# Patient Record
Sex: Female | Born: 1955
Health system: Southern US, Community
[De-identification: ages and names within clinical notes are randomized; demographics above are authoritative.]

## PROBLEM LIST (undated history)

## (undated) DIAGNOSIS — E785 Hyperlipidemia, unspecified: Secondary | ICD-10-CM

## (undated) DIAGNOSIS — G4730 Sleep apnea, unspecified: Secondary | ICD-10-CM

## (undated) DIAGNOSIS — F32A Depression, unspecified: Secondary | ICD-10-CM

## (undated) DIAGNOSIS — K219 Gastro-esophageal reflux disease without esophagitis: Secondary | ICD-10-CM

## (undated) DIAGNOSIS — F99 Mental disorder, not otherwise specified: Secondary | ICD-10-CM

## (undated) DIAGNOSIS — Z9889 Other specified postprocedural states: Secondary | ICD-10-CM

## (undated) DIAGNOSIS — T7840XA Allergy, unspecified, initial encounter: Secondary | ICD-10-CM

## (undated) DIAGNOSIS — M1990 Unspecified osteoarthritis, unspecified site: Secondary | ICD-10-CM

## (undated) DIAGNOSIS — M5126 Other intervertebral disc displacement, lumbar region: Secondary | ICD-10-CM

## (undated) DIAGNOSIS — H269 Unspecified cataract: Secondary | ICD-10-CM

## (undated) DIAGNOSIS — N951 Menopausal and female climacteric states: Secondary | ICD-10-CM

## (undated) DIAGNOSIS — I10 Essential (primary) hypertension: Secondary | ICD-10-CM

## (undated) HISTORY — DX: Other intervertebral disc displacement, lumbar region: M51.26

## (undated) HISTORY — PX: BREAST EXCISIONAL BIOPSY: SUR124

## (undated) HISTORY — DX: Mental disorder, not otherwise specified: F99

## (undated) HISTORY — DX: Allergy, unspecified, initial encounter: T78.40XA

## (undated) HISTORY — DX: Unspecified cataract: H26.9

## (undated) HISTORY — PX: STEROID INJECTION TO SCAR: SHX2447

## (undated) HISTORY — DX: Depression, unspecified: F32.A

## (undated) HISTORY — DX: Hyperlipidemia, unspecified: E78.5

## (undated) HISTORY — DX: Sleep apnea, unspecified: G47.30

## (undated) HISTORY — DX: Gastro-esophageal reflux disease without esophagitis: K21.9

## (undated) HISTORY — DX: Essential (primary) hypertension: I10

## (undated) HISTORY — PX: JOINT REPLACEMENT: SHX530

## (undated) HISTORY — DX: Menopausal and female climacteric states: N95.1

## (undated) HISTORY — DX: Other specified postprocedural states: Z98.890

## (undated) HISTORY — PX: FRACTURE SURGERY: SHX138

## (undated) HISTORY — DX: Unspecified osteoarthritis, unspecified site: M19.90

---

## 1988-05-16 HISTORY — PX: BREAST BIOPSY: SHX20

## 2001-11-13 HISTORY — PX: FOOT FRACTURE SURGERY: SHX645

## 2002-05-16 LAB — HM COLONOSCOPY: HM Colonoscopy: NORMAL

## 2004-10-14 HISTORY — PX: ANKLE FRACTURE SURGERY: SHX122

## 2008-07-14 HISTORY — PX: KNEE ARTHROSCOPY: SHX127

## 2009-01-27 ENCOUNTER — Encounter: Admission: RE | Admit: 2009-01-27 | Discharge: 2009-01-27 | Payer: Self-pay | Admitting: General Surgery

## 2009-03-27 ENCOUNTER — Ambulatory Visit: Payer: Self-pay | Admitting: Family Medicine

## 2009-03-27 DIAGNOSIS — E785 Hyperlipidemia, unspecified: Secondary | ICD-10-CM | POA: Insufficient documentation

## 2009-03-27 DIAGNOSIS — J309 Allergic rhinitis, unspecified: Secondary | ICD-10-CM | POA: Insufficient documentation

## 2009-03-27 DIAGNOSIS — I10 Essential (primary) hypertension: Secondary | ICD-10-CM | POA: Insufficient documentation

## 2009-03-27 DIAGNOSIS — F339 Major depressive disorder, recurrent, unspecified: Secondary | ICD-10-CM | POA: Insufficient documentation

## 2009-03-27 LAB — CONVERTED CEMR LAB
Bilirubin Urine: NEGATIVE
Blood in Urine, dipstick: NEGATIVE
Glucose, Urine, Semiquant: NEGATIVE
Ketones, urine, test strip: NEGATIVE
Nitrite: NEGATIVE
Protein, U semiquant: NEGATIVE
Specific Gravity, Urine: 1.01
Urobilinogen, UA: 0.2
WBC Urine, dipstick: NEGATIVE
pH: 5.5

## 2009-04-17 ENCOUNTER — Encounter: Payer: Self-pay | Admitting: Family Medicine

## 2009-04-18 LAB — CONVERTED CEMR LAB
ALT: 20 units/L (ref 0–35)
AST: 17 units/L (ref 0–37)
Albumin: 4.2 g/dL (ref 3.5–5.2)
Alkaline Phosphatase: 74 units/L (ref 39–117)
BUN: 23 mg/dL (ref 6–23)
CO2: 24 meq/L (ref 19–32)
Calcium: 9.4 mg/dL (ref 8.4–10.5)
Chloride: 103 meq/L (ref 96–112)
Creatinine, Ser: 0.92 mg/dL (ref 0.40–1.20)
Glucose, Bld: 104 mg/dL — ABNORMAL HIGH (ref 70–99)
Potassium: 4.2 meq/L (ref 3.5–5.3)
Sodium: 141 meq/L (ref 135–145)
Total Bilirubin: 0.7 mg/dL (ref 0.3–1.2)
Total Protein: 6.8 g/dL (ref 6.0–8.3)

## 2009-04-27 ENCOUNTER — Ambulatory Visit: Payer: Self-pay | Admitting: Family Medicine

## 2009-06-08 ENCOUNTER — Telehealth: Payer: Self-pay | Admitting: Family Medicine

## 2009-07-14 ENCOUNTER — Encounter: Admission: RE | Admit: 2009-07-14 | Discharge: 2009-07-14 | Payer: Self-pay | Admitting: General Surgery

## 2009-07-15 LAB — HM MAMMOGRAPHY: HM Mammogram: NORMAL

## 2009-09-22 ENCOUNTER — Ambulatory Visit: Payer: Self-pay | Admitting: Family Medicine

## 2009-09-22 DIAGNOSIS — I6529 Occlusion and stenosis of unspecified carotid artery: Secondary | ICD-10-CM | POA: Insufficient documentation

## 2009-09-24 ENCOUNTER — Ambulatory Visit (HOSPITAL_COMMUNITY): Admission: RE | Admit: 2009-09-24 | Discharge: 2009-09-24 | Payer: Self-pay | Admitting: Family Medicine

## 2009-09-24 ENCOUNTER — Ambulatory Visit: Payer: Self-pay | Admitting: Vascular Surgery

## 2009-09-24 ENCOUNTER — Encounter: Payer: Self-pay | Admitting: Family Medicine

## 2009-10-21 ENCOUNTER — Encounter: Payer: Self-pay | Admitting: Family Medicine

## 2009-10-21 ENCOUNTER — Ambulatory Visit: Payer: Self-pay | Admitting: Obstetrics & Gynecology

## 2009-10-23 LAB — CONVERTED CEMR LAB
ALT: 20 units/L (ref 0–35)
AST: 17 units/L (ref 0–37)
Albumin: 4.4 g/dL (ref 3.5–5.2)
Alkaline Phosphatase: 78 units/L (ref 39–117)
BUN: 21 mg/dL (ref 6–23)
CO2: 22 meq/L (ref 19–32)
Calcium: 9.7 mg/dL (ref 8.4–10.5)
Chloride: 105 meq/L (ref 96–112)
Cholesterol: 208 mg/dL — ABNORMAL HIGH (ref 0–200)
Creatinine, Ser: 0.85 mg/dL (ref 0.40–1.20)
Glucose, Bld: 97 mg/dL (ref 70–99)
HDL: 72 mg/dL (ref >39)
LDL Cholesterol: 111 mg/dL — ABNORMAL HIGH (ref 0–99)
Potassium: 4.2 meq/L (ref 3.5–5.3)
Sodium: 141 meq/L (ref 135–145)
Total Bilirubin: 1 mg/dL (ref 0.3–1.2)
Total CHOL/HDL Ratio: 2.9
Total Protein: 7.4 g/dL (ref 6.0–8.3)
Triglycerides: 127 mg/dL (ref <150)
VLDL: 25 mg/dL (ref 0–40)

## 2009-12-10 ENCOUNTER — Encounter: Admission: RE | Admit: 2009-12-10 | Discharge: 2009-12-10 | Payer: Self-pay | Admitting: General Surgery

## 2010-01-27 ENCOUNTER — Ambulatory Visit: Payer: Self-pay | Admitting: Family Medicine

## 2010-03-15 ENCOUNTER — Encounter: Payer: Self-pay | Admitting: Family Medicine

## 2010-03-16 HISTORY — PX: REPLACEMENT TOTAL KNEE: SUR1224

## 2010-03-22 ENCOUNTER — Inpatient Hospital Stay (HOSPITAL_COMMUNITY): Admission: RE | Admit: 2010-03-22 | Discharge: 2010-03-25 | Payer: Self-pay | Admitting: Orthopedic Surgery

## 2010-03-30 ENCOUNTER — Encounter: Payer: Self-pay | Admitting: Family Medicine

## 2010-05-05 ENCOUNTER — Encounter
Admission: RE | Admit: 2010-05-05 | Discharge: 2010-05-05 | Payer: Self-pay | Source: Home / Self Care | Attending: Sports Medicine | Admitting: Sports Medicine

## 2010-06-14 ENCOUNTER — Ambulatory Visit: Admit: 2010-06-14 | Payer: Self-pay | Admitting: Family Medicine

## 2010-06-15 NOTE — Medication Information (Signed)
Summary: Med Issue Order/[facility]  Med Issue Order/[facility]   Imported By: Lanelle Bal 04/12/2010 08:28:43  _____________________________________________________________________  External Attachment:    Type:   Image     Comment:   External Document

## 2010-06-15 NOTE — Assessment & Plan Note (Signed)
Summary: MEDICAL CLEARANCE   Vital Signs:  Patient profile:   55 year old female Height:      65 inches Weight:      257 pounds BMI:     42.92 Pulse rate:   65 / minute BP sitting:   130 / 70  (right arm) Cuff size:   large  Vitals Entered By: Avon Gully CMA, Duncan Dull) (January 27, 2010 8:53 AM) CC: Med clearance,   Primary Care Provider:  [name]  CC:  Med clearance and .  History of Present Illness: Right knee surgery in November with Dr. Despina Hick at [facility].  Her for medical clearance.  She has the following medical problems  Current Problems:  HYPERTENSION, BENIGN (ICD-401.1) DEPRESSION (ICD-311) HYPERLIPIDEMIA (ICD-272.4) ALLERGIC RHINITIS (ICD-477.9)        Current Medications (verified): 1)  Losartan Potassium-Hctz 50-12.5 Mg Tabs (Losartan Potassium-Hctz) .... Take 1 Tablet By Mouth Once A Day 2)  Simvastatin 40 Mg Tabs (Simvastatin) .Marland Kitchen.. 1 1/2 Tab Once Daily 3)  Fexofenadine Hcl 180 Mg Tabs (Fexofenadine Hcl) .Marland Kitchen.. 1 Once Daily As Needed Allergies 4)  Baby Aspirin 81 Mg Chew (Aspirin) .... Once Daily 5)  Lexapro 20 Mg Tabs (Escitalopram Oxalate) .... Take One Tablet By Mouth Once A Day 6)  Meloxicam 7.5 Mg Tabs (Meloxicam) .... Take 1 Tablet By Mouth Once A Day 7)  Tramadol-Acetaminophen 37.5-325 Mg Tabs (Tramadol-Acetaminophen) .Marland Kitchen.. 1-2 Tabs By [facility] Every 4-6 Hours As Needed  Allergies (verified): 1)  ! Penicillin  Comments:  Nurse/Medical Assistant: The patient's medications and allergies were reviewed with the patient and were updated in the Medication and Allergy Lists. Avon Gully CMA, Duncan Dull) (January 27, 2010 8:54 AM)  Past History:  Past Medical History: Last updated: 03/27/2009 Allergic rhinitis Hyperlipidemia  Past Surgical History: Breast biopsy 1990, again in 12/2007 benign - Dr. Dwain Sarna at [facility] Right knee Arthroscopy 07/2008 -Dr. 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  Some college. Married to [place] with 2 adult kids.  Never Smoked Alcohol use-no Drug use-no Regular exercise-no 4 caffeinated drinks per day.    Review of Systems  The patient denies anorexia, fever, weight loss, weight gain, vision loss, decreased hearing, hoarseness, chest pain, syncope, dyspnea on exertion, peripheral edema, prolonged cough, headaches, hemoptysis, abdominal pain, melena, hematochezia, severe indigestion/heartburn, hematuria, incontinence, genital sores, muscle weakness, suspicious skin lesions, transient blindness, difficulty walking, depression, unusual weight change, abnormal bleeding, enlarged lymph nodes, and breast masses.    Physical Exam  General:  Well-developed,well-nourished,in no acute distress; alert,appropriate and cooperative throughout examination Head:  Normocephalic and atraumatic without obvious abnormalities. No apparent alopecia or balding. Eyes:  No corneal or conjunctival inflammation noted. EOMI. Perrla.  Ears:  External ear exam shows no significant lesions or deformities.  Otoscopic examination reveals clear canals, tympanic membranes are intact bilaterally without bulging, retraction, inflammation or discharge. Hearing is grossly normal bilaterally. Nose:  External nasal examination shows no deformity or inflammation.  Mouth:  Oral mucosa and oropharynx without lesions or exudates.  Neck:  No deformities, masses, or tenderness noted. Chest Wall:  No deformities, masses, or tenderness noted. Lungs:  Normal respiratory effort, chest expands symmetrically. Lungs are clear to auscultation, no crackles  or wheezes. Heart:  Normal rate and regular rhythm.  S1 and S2 normal without gallop, murmur, click, rub or other extra sounds. Abdomen:  Bowel sounds positive,abdomen soft and non-tender without masses, organomegaly or hernias noted. Msk:  No deformity or scoliosis noted of thoracic or lumbar spine.   Pulses:  R and L carotid,radial,dorsalis pedis and posterior tibial pulses are full and equal bilaterally Extremities:  No clubbing, cyanosis, edema, or deformity noted with normal full range of motion of all joints.   Neurologic:  No cranial nerve deficits noted. Station and gait are normal.  DTRs are symmetrical throughout. Sensory, motor and coordinative functions appear intact. Skin:  no rashes.   Cervical Nodes:  No lymphadenopathy noted Psych:  Cognition and judgment appear intact. Alert and cooperative with normal attention span and concentration. No apparent delusions, illusions, hallucinations   Impression & Recommendations:  Problem # 1:  UNSPECIFIED GENERAL MEDICAL EXAMINATION (ICD-V70.9)  Exam is normal today. EKG shows Assumming her labs are normal then she is medically cleared for surgery. We call ed teh [place] ortho office and they will draw labs prior to surgery.  She plans on getting her flu vac through work. EKG shows  rate 68 bpm, no acute chages, NSR.    Orders: EKG w/ Interpretation (93000)  Complete Medication List: 1)  Losartan Potassium-hctz 50-12.5 Mg Tabs (Losartan potassium-hctz) .... Take 1 tablet by mouth once a day 2)  Simvastatin 40 Mg Tabs (Simvastatin) .Marland Kitchen.. 1 1/2 tab once daily 3)  Fexofenadine Hcl 180 Mg Tabs (Fexofenadine hcl) .Marland Kitchen.. 1 once daily as needed allergies 4)  Baby Aspirin 81 Mg Chew (Aspirin) .... Once daily 5)  Lexapro 20 Mg Tabs (Escitalopram oxalate) .... Take one tablet by mouth once a day 6)  Meloxicam 7.5 Mg Tabs (Meloxicam) .... Take 1 tablet by mouth once a day 7)  Tramadol-acetaminophen 37.5-325 Mg Tabs (Tramadol-acetaminophen) .Marland Kitchen.. 1-2 tabs by mouthe every 4-6 hours as needed

## 2010-06-15 NOTE — Progress Notes (Signed)
Summary: med question  Phone Note Call from Patient Call back at [facility] Phone [phone] Call back at [phone]   Caller: Patient Call For: [name] Summary of Call: Avalide on back order. Can they switch to Hyzaar 50/12.5mg  100/12.5mg  or 100/25mg . Or any other BP med. Or give Avapro and HCTZ which will results in 2 co pays.  Ref # [id number] Initial call taken by: Kathlene November,  June 08, 2009 3:03 PM  Follow-up for Phone Call        oK to substitute for hyzaar 50/12.5mg  once daily  Follow-up by: [name],  June 08, 2009 3:55 PM  Additional Follow-up for Phone Call Additional follow up Details #1::        Pharmacy notified to change med. Additional Follow-up by: Kathlene November,  June 08, 2009 4:00 PM

## 2010-06-15 NOTE — Assessment & Plan Note (Signed)
Summary: 6 MONTH FU HTN, lipids, mood ,etc   Vital Signs:  Patient profile:   55 year old female Height:      65 inches Weight:      256 pounds Pulse rate:   81 / minute BP sitting:   119 / 68  (left arm) Cuff size:   large  Vitals Entered By: Kathlene November (Sep 22, 2009 8:31 AM) CC: followup BP, Hypertension Management   Primary Care Provider:  [name]  CC:  followup BP and Hypertension Management.  History of Present Illness: Has lost 12  lbs. On weight watchers and doing well.  Her eto f/u BP. Days she has a hx of carotid artery stenosis and used to get dopplers yearly. We still odn't have her old records. Says has been 2 years since her last dopppler and would like to go ahead and schedule that.   Hypertension History:      She denies headache, chest pain, palpitations, dyspnea with exertion, orthopnea, PND, peripheral edema, visual symptoms, neurologic problems, syncope, and side effects from treatment.  She notes no problems with any antihypertensive medication side effects.        Positive major cardiovascular risk factors include female age 66 years old or older, hyperlipidemia, and hypertension.  Negative major cardiovascular risk factors include non-tobacco-user status.     Current Medications (verified): 1)  Avalide 150-12.5 Mg Tabs (Irbesartan-Hydrochlorothiazide) .... Take 1 Tablet By Mouth Once A Day 2)  Simvastatin 40 Mg Tabs (Simvastatin) .Marland Kitchen.. 1 1/2 Tab Once Daily 3)  Fexofenadine Hcl 180 Mg Tabs (Fexofenadine Hcl) .Marland Kitchen.. 1 Once Daily As Needed Allergies 4)  Baby Aspirin 81 Mg Chew (Aspirin) .... Once Daily 5)  Lexapro 20 Mg Tabs (Escitalopram Oxalate) .... Take One Tablet By Mouth Once A Day  Allergies (verified): 1)  ! Penicillin  Comments:  Nurse/Medical Assistant: The patient's medications and allergies were reviewed with the patient and were updated in the Medication and Allergy Lists. Kathlene November (Sep 22, 2009 8:31 AM)  Physical  Exam  General:  Well-developed,well-nourished,in no acute distress; alert,appropriate and cooperative throughout examination Head:  Normocephalic and atraumatic without obvious abnormalities. No apparent alopecia or balding. Lungs:  Normal respiratory effort, chest expands symmetrically. Lungs are clear to auscultation, no crackles or wheezes. Heart:  Normal rate and regular rhythm. S1 and S2 normal without gallop, murmur, click, rub or other extra sounds. Skin:  no rashes.   Cervical Nodes:  No lymphadenopathy noted Psych:  Cognition and judgment appear intact. Alert and cooperative with normal attention span and concentration. No apparent delusions, illusions, hallucinations   Impression & Recommendations:  Problem # 1:  HYPERTENSION, BENIGN (ICD-401.1) Doing well. I keeps up weight loss may be able to decrease her medication. NOt having any dizziness.   Her updated medication list for this problem includes:    Losartan Potassium-hctz 50-12.5 Mg Tabs (Losartan potassium-hctz) .Marland Kitchen... Take 1 tablet by mouth once a day  BP today: 119/68 Prior BP: 114/67 (04/27/2009)  Prior 10 Yr Risk Heart Disease: Not enough information (04/27/2009)  Labs Reviewed: K+: 4.2 (04/17/2009) Creat: : 0.92 (04/17/2009)     Orders: T-Comprehensive Metabolic Panel ([id number])  Problem # 2:  DEPRESSION (ICD-311) Needs refill on lexapro. SHe is happy wiht her current dose and regimen.  Her updated medication list for this problem includes:    Lexapro 20 Mg Tabs (Escitalopram oxalate) .Marland Kitchen... Take one tablet by mouth once a day  Problem # 3:  CAROTID STENOSIS (ICD-433.10) Will schedule  for dopplers. sine she is on a statin ( if she is well controlled) then she may not need yearly dopplers. Will get old records and order new test.  Her updated medication list for this problem includes:    Baby Aspirin 81 Mg Chew (Aspirin) ..... Once daily  Orders: T-*Unlisted Diagnostic X-ray test/procedure  ([address])  Problem # 4:  HYPERLIPIDEMIA (ICD-272.4) Over due to check levels to make sure well controlled. Clerly her weight loss will help.  Her updated medication list for this problem includes:    Simvastatin 40 Mg Tabs (Simvastatin) .Marland Kitchen... 1 1/2 tab once daily  Orders: T-Comprehensive Metabolic Panel [phone]) T-Lipid Profile [id number])  Complete Medication List: 1)  Losartan Potassium-hctz 50-12.5 Mg Tabs (Losartan potassium-hctz) .... Take 1 tablet by mouth once a day 2)  Simvastatin 40 Mg Tabs (Simvastatin) .Marland Kitchen.. 1 1/2 tab once daily 3)  Fexofenadine Hcl 180 Mg Tabs (Fexofenadine hcl) .Marland Kitchen.. 1 once daily as needed allergies 4)  Baby Aspirin 81 Mg Chew (Aspirin) .... Once daily 5)  Lexapro 20 Mg Tabs (Escitalopram oxalate) .... Take one tablet by mouth once a day 6)  Meloxicam 7.5 Mg Tabs (Meloxicam) .... Take 1 tablet by mouth once a day 7)  Tramadol-acetaminophen 37.5-325 Mg Tabs (Tramadol-acetaminophen) .Marland Kitchen.. 1-2 tabs by mouthe every 4-6 hours as needed  Hypertension Assessment/Plan:      The patient's hypertensive risk group is category B: At least one risk factor (excluding diabetes) with no target organ damage.  Today's blood pressure is 119/68.  Her blood pressure goal is < 140/90.  Patient Instructions: 1)  Please schedule a follow-up appointment in 6 months for blood pressure. Come in sooner if feels BP is going too low.  2)  We will get your lab results in 2-3 days.   Prescriptions: LEXAPRO 20 MG TABS (ESCITALOPRAM OXALATE) Take one tablet by mouth once a day Brand medically necessary #90 x 3   Entered and Authorized by:   [name]   Signed by:   [name] on 09/22/2009   Method used:   Electronically to        [facility]* (mail-order)             ,          Ph: [phone]       Fax: [phone]   RxID:   [id number]   Colonoscopy Result Date:  05/16/2002 Colonoscopy Result:  normal

## 2010-06-15 NOTE — Miscellaneous (Signed)
  Clinical Lists Changes  Observations: Added new observation of FLU VAX: Historical (03/13/2010 10:44)      Immunization History:  Influenza Immunization History:    Influenza:  historical (03/13/2010)

## 2010-06-15 NOTE — Assessment & Plan Note (Signed)
Summary: 1 MONTH FU HTN, Depression   Vital Signs:  Patient profile:   55 year old female Height:      65 inches Weight:      268 pounds Pulse rate:   89 / minute BP sitting:   114 / 67  (left arm) Cuff size:   large  Vitals Entered By: Kathlene November (April 27, 2009 8:26 AM) CC: followup BP, Hypertension Management   CC:  followup BP and Hypertension Management.  Hypertension History:      She denies headache, chest pain, palpitations, dyspnea with exertion, orthopnea, PND, peripheral edema, visual symptoms, neurologic problems, syncope, and side effects from treatment.  She notes no problems with any antihypertensive medication side effects.  Toleating well. Cost is not an issue. Does have trace edema in the right ankle but this is chronic. NO SOB ro swelling. Marland Kitchen        Positive major cardiovascular risk factors include female age 40 years old or older, hyperlipidemia, and hypertension.  Negative major cardiovascular risk factors include non-tobacco-user status.     Current Medications (verified): 1)  Avalide 150-12.5 Mg Tabs (Irbesartan-Hydrochlorothiazide) .... Take 1 Tablet By Mouth Once A Day 2)  Simvastatin 40 Mg Tabs (Simvastatin) .Marland Kitchen.. 1 1/2 Tab Once Daily 3)  Fexofenadine Hcl 180 Mg Tabs (Fexofenadine Hcl) .Marland Kitchen.. 1 Once Daily As Needed Allergies 4)  Baby Aspirin 81 Mg Chew (Aspirin) .... Once Daily 5)  Lexapro 20 Mg Tabs (Escitalopram Oxalate) .... Take One Tablet By Mouth Once A Day  Allergies (verified): 1)  ! Penicillin  Comments:  Nurse/Medical Assistant: The patient's medications and allergies were reviewed with the patient and were updated in the Medication and Allergy Lists. Kathlene November (April 27, 2009 8:27 AM)  Physical Exam  General:  Well-developed,well-nourished,in no acute distress; alert,appropriate and cooperative throughout examination Lungs:  Normal respiratory effort, chest expands symmetrically. Lungs are clear to auscultation, no crackles or  wheezes. Heart:  Normal rate and regular rhythm. S1 and S2 normal without gallop, murmur, click, rub or other extra sounds. Skin:  no rashes.   Psych:  Cognition and judgment appear intact. Alert and cooperative with normal attention span and concentration. No apparent delusions, illusions, hallucinations   Impression & Recommendations:  Problem # 1:  HYPERTENSION, BENIGN (ICD-401.1) Fu in 6 months. Doing great At goal. Has lost a couple of pounds as well.  Her updated medication list for this problem includes:    Avalide 150-12.5 Mg Tabs (Irbesartan-hydrochlorothiazide) .Marland Kitchen... Take 1 tablet by mouth once a day  BP today: 114/67 Prior BP: 140/87 (03/27/2009)  Labs Reviewed: K+: 4.2 (04/17/2009) Creat: : 0.92 (04/17/2009)     Problem # 2:  DEPRESSION (ICD-311) Decided to go back up to Lexapro 20mg .  Found out her son who is in the [employer] may be deployed. So decided to go back up to 20mg  on her dose.  The following medications were removed from the medication list:    Lexapro 10 Mg Tabs (Escitalopram oxalate) .Marland Kitchen... Take 1 tablet by mouth once a day Her updated medication list for this problem includes:    Lexapro 20 Mg Tabs (Escitalopram oxalate) .Marland Kitchen... Take one tablet by mouth once a day  Complete Medication List: 1)  Avalide 150-12.5 Mg Tabs (Irbesartan-hydrochlorothiazide) .... Take 1 tablet by mouth once a day 2)  Simvastatin 40 Mg Tabs (Simvastatin) .Marland Kitchen.. 1 1/2 tab once daily 3)  Fexofenadine Hcl 180 Mg Tabs (Fexofenadine hcl) .Marland Kitchen.. 1 once daily as needed allergies 4)  Baby  Aspirin 81 Mg Chew (Aspirin) .... Once daily 5)  Lexapro 20 Mg Tabs (Escitalopram oxalate) .... Take one tablet by mouth once a day  Hypertension Assessment/Plan:      The patient's hypertensive risk group is category B: At least one risk factor (excluding diabetes) with no target organ damage.  Today's blood pressure is 114/67.  Her blood pressure goal is < 140/90. Prescriptions: AVALIDE 150-12.5 MG TABS  (IRBESARTAN-HYDROCHLOROTHIAZIDE) Take 1 tablet by mouth once a day  #30 x 1   Entered and Authorized by:   [name]   Signed by:   [name] on 04/27/2009   Method used:   Electronically to        [facility]* (retail)       [address] [place], [place]  [address]       Ph: [phone]       Fax: [phone]   RxID:   [id number]
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
## 2010-06-15 NOTE — Letter (Signed)
Summary: Generic Letter  [facility]  [address]   [place], [place] [address]   Phone: [phone]  Fax: [phone]    01/27/2010  Dear Dr. Despina Hick at [facility],   [name],DOB Jun 15, 2055, has been evaluated in our office and is medically cleared for surgery. She has the following medical problems and medications, and thus is low risk for surgery. No prior cardiac or pulmonary problems. EKG was normal. We did not draw labs today as she is 2 months out from her surgery date.  We called your office and they verified that the office will draw her pre-op labs closer to the surgery date.   Current Problems:  HYPERTENSION, BENIGN (ICD-401.1) DEPRESSION (ICD-311) HYPERLIPIDEMIA (ICD-272.4) ALLERGIC RHINITIS (ICD-477.9)  Current Meds:  LOSARTAN POTASSIUM-HCTZ 50-12.5 MG TABS (LOSARTAN POTASSIUM-HCTZ) Take 1 tablet by mouth once a day SIMVASTATIN 40 MG TABS (SIMVASTATIN) 1 1/2 tab once daily FEXOFENADINE HCL 180 MG TABS (FEXOFENADINE HCL) 1 once daily as needed allergies BABY ASPIRIN 81 MG CHEW (ASPIRIN) once daily LEXAPRO 20 MG TABS (ESCITALOPRAM OXALATE) Take one tablet by mouth once a day [BMN] MELOXICAM 7.5 MG TABS (MELOXICAM) Take 1 tablet by mouth once a day TRAMADOL-ACETAMINOPHEN 37.5-325 MG TABS (TRAMADOL-ACETAMINOPHEN) 1-2 tabs by mouthe every 4-6 hours as needed   If you have any questions please feel free to call our office.   Sincerely,   [name]

## 2010-06-30 ENCOUNTER — Other Ambulatory Visit: Payer: Self-pay | Admitting: General Surgery

## 2010-07-05 ENCOUNTER — Other Ambulatory Visit: Payer: Self-pay | Admitting: General Surgery

## 2010-07-21 ENCOUNTER — Ambulatory Visit
Admission: RE | Admit: 2010-07-21 | Discharge: 2010-07-21 | Disposition: A | Discharge status: Home / Self Care | Payer: BC Managed Care – PPO | Source: Ambulatory Visit | Attending: General Surgery | Admitting: General Surgery

## 2010-07-27 LAB — CBC
HCT: 30.5 % — ABNORMAL LOW (ref 36.0–46.0)
HCT: 33.6 % — ABNORMAL LOW (ref 36.0–46.0)
HCT: 37 % (ref 36.0–46.0)
Hemoglobin: 10.8 g/dL — ABNORMAL LOW (ref 12.0–15.0)
Hemoglobin: 11.7 g/dL — ABNORMAL LOW (ref 12.0–15.0)
Hemoglobin: 12.6 g/dL (ref 12.0–15.0)
MCH: 31.1 pg (ref 26.0–34.0)
MCH: 31.6 pg (ref 26.0–34.0)
MCH: 31.9 pg (ref 26.0–34.0)
MCHC: 33.9 g/dL (ref 30.0–36.0)
MCHC: 34.8 g/dL (ref 30.0–36.0)
MCHC: 35.2 g/dL (ref 30.0–36.0)
MCV: 90.5 fL (ref 78.0–100.0)
MCV: 91 fL (ref 78.0–100.0)
MCV: 91.8 fL (ref 78.0–100.0)
Platelets: 195 10*3/uL (ref 150–400)
Platelets: 212 10*3/uL (ref 150–400)
Platelets: 224 10*3/uL (ref 150–400)
RBC: 3.37 MIL/uL — ABNORMAL LOW (ref 3.87–5.11)
RBC: 3.69 MIL/uL — ABNORMAL LOW (ref 3.87–5.11)
RBC: 4.03 MIL/uL (ref 3.87–5.11)
RDW: 12.9 % (ref 11.5–15.5)
RDW: 13.3 % (ref 11.5–15.5)
RDW: 13.4 % (ref 11.5–15.5)
WBC: 11.2 10*3/uL — ABNORMAL HIGH (ref 4.0–10.5)
WBC: 8.7 10*3/uL (ref 4.0–10.5)
WBC: 9.9 10*3/uL (ref 4.0–10.5)

## 2010-07-27 LAB — BASIC METABOLIC PANEL
BUN: 12 mg/dL (ref 6–23)
BUN: 14 mg/dL (ref 6–23)
BUN: 8 mg/dL (ref 6–23)
CO2: 28 mEq/L (ref 19–32)
CO2: 29 mEq/L (ref 19–32)
CO2: 30 mEq/L (ref 19–32)
Calcium: 8.6 mg/dL (ref 8.4–10.5)
Calcium: 8.8 mg/dL (ref 8.4–10.5)
Calcium: 8.9 mg/dL (ref 8.4–10.5)
Chloride: 100 mEq/L (ref 96–112)
Chloride: 96 mEq/L (ref 96–112)
Chloride: 99 mEq/L (ref 96–112)
Creatinine, Ser: 0.8 mg/dL (ref 0.4–1.2)
Creatinine, Ser: 0.89 mg/dL (ref 0.4–1.2)
Creatinine, Ser: 1 mg/dL (ref 0.4–1.2)
GFR calc Af Amer: >60 mL/min (ref >60)
GFR calc Af Amer: >60 mL/min (ref >60)
GFR calc Af Amer: >60 mL/min (ref >60)
GFR calc non Af Amer: 57 mL/min — ABNORMAL LOW (ref >60)
GFR calc non Af Amer: >60 mL/min (ref >60)
GFR calc non Af Amer: >60 mL/min (ref >60)
Glucose, Bld: 116 mg/dL — ABNORMAL HIGH (ref 70–99)
Glucose, Bld: 136 mg/dL — ABNORMAL HIGH (ref 70–99)
Glucose, Bld: 152 mg/dL — ABNORMAL HIGH (ref 70–99)
Potassium: 3.9 mEq/L (ref 3.5–5.1)
Potassium: 4.2 mEq/L (ref 3.5–5.1)
Potassium: 4.6 mEq/L (ref 3.5–5.1)
Sodium: 134 mEq/L — ABNORMAL LOW (ref 135–145)
Sodium: 136 mEq/L (ref 135–145)
Sodium: 136 mEq/L (ref 135–145)

## 2010-07-27 LAB — TYPE AND SCREEN
ABO/RH(D): O POS
Antibody Screen: NEGATIVE

## 2010-07-27 LAB — ABO/RH: ABO/RH(D): O POS

## 2010-07-28 LAB — COMPREHENSIVE METABOLIC PANEL
ALT: 36 U/L — ABNORMAL HIGH (ref 0–35)
AST: 27 U/L (ref 0–37)
Albumin: 3.9 g/dL (ref 3.5–5.2)
Alkaline Phosphatase: 71 U/L (ref 39–117)
BUN: 24 mg/dL — ABNORMAL HIGH (ref 6–23)
CO2: 26 mEq/L (ref 19–32)
Calcium: 9.1 mg/dL (ref 8.4–10.5)
Chloride: 108 mEq/L (ref 96–112)
Creatinine, Ser: 0.86 mg/dL (ref 0.4–1.2)
GFR calc Af Amer: >60 mL/min (ref >60)
GFR calc non Af Amer: >60 mL/min (ref >60)
Glucose, Bld: 91 mg/dL (ref 70–99)
Potassium: 3.8 mEq/L (ref 3.5–5.1)
Sodium: 142 mEq/L (ref 135–145)
Total Bilirubin: 0.7 mg/dL (ref 0.3–1.2)
Total Protein: 6.6 g/dL (ref 6.0–8.3)

## 2010-07-28 LAB — URINALYSIS, ROUTINE W REFLEX MICROSCOPIC
Bilirubin Urine: NEGATIVE
Glucose, UA: NEGATIVE mg/dL
Hgb urine dipstick: NEGATIVE
Ketones, ur: NEGATIVE mg/dL
Nitrite: NEGATIVE
Protein, ur: NEGATIVE mg/dL
Specific Gravity, Urine: 1.025 (ref 1.005–1.030)
Urobilinogen, UA: 0.2 mg/dL (ref 0.0–1.0)
pH: 6 (ref 5.0–8.0)

## 2010-07-28 LAB — CBC
HCT: 40.3 % (ref 36.0–46.0)
Hemoglobin: 14.2 g/dL (ref 12.0–15.0)
MCH: 31.7 pg (ref 26.0–34.0)
MCHC: 35.2 g/dL (ref 30.0–36.0)
MCV: 90.2 fL (ref 78.0–100.0)
Platelets: 238 10*3/uL (ref 150–400)
RBC: 4.47 MIL/uL (ref 3.87–5.11)
RDW: 13.3 % (ref 11.5–15.5)
WBC: 6.1 10*3/uL (ref 4.0–10.5)

## 2010-07-28 LAB — PROTIME-INR
INR: 0.89 (ref 0.00–1.49)
Prothrombin Time: 12.2 seconds (ref 11.6–15.2)

## 2010-07-28 LAB — SURGICAL PCR SCREEN
MRSA, PCR: NEGATIVE
Staphylococcus aureus: NEGATIVE

## 2010-07-28 LAB — APTT: aPTT: 31 seconds (ref 24–37)

## 2010-07-30 ENCOUNTER — Ambulatory Visit (INDEPENDENT_AMBULATORY_CARE_PROVIDER_SITE_OTHER): Payer: BC Managed Care – PPO | Admitting: Family Medicine

## 2010-07-30 ENCOUNTER — Encounter: Payer: Self-pay | Admitting: Family Medicine

## 2010-07-30 DIAGNOSIS — J019 Acute sinusitis, unspecified: Secondary | ICD-10-CM | POA: Insufficient documentation

## 2010-07-30 DIAGNOSIS — I10 Essential (primary) hypertension: Secondary | ICD-10-CM

## 2010-08-03 NOTE — Assessment & Plan Note (Signed)
Summary: [name], HTN   Vital Signs:  Patient profile:   55 year old female Height:      65 inches Weight:      270 pounds O2 Sat:      95 % on Room air BP sitting:   136 / 78  (right arm) Cuff size:   large  Vitals Entered By: Avon Gully CMA, (AAMA) (July 30, 2010 8:30 AM)  O2 Flow:  Room air CC: sinus pressure and congestion, coughing at night, Hypertension Management   Primary Care Provider:  [name]  CC:  sinus pressure and congestion, coughing at night, and Hypertension Management.  History of Present Illness: sinus pressure and congestion, coughing at night.  Bilateral congestion for 2 weeks.  cough most of the night, dry.  No fever.  Right ear occ pain and stuffed. Mild ST.  No GI sxs.  Was taking mucinex - not helping.  No recent ABX.   Hypertension History:      She denies headache, chest pain, palpitations, dyspnea with exertion, orthopnea, PND, peripheral edema, visual symptoms, neurologic problems, syncope, and side effects from treatment.  She notes no problems with any antihypertensive medication side effects.        Positive major cardiovascular risk factors include female age 57 years old or older, hyperlipidemia, and hypertension.  Negative major cardiovascular risk factors include non-tobacco-user status.     Current Medications (verified): 1)  Losartan Potassium-Hctz 50-12.5 Mg Tabs (Losartan Potassium-Hctz) .... Take 1 Tablet By Mouth Once A Day 2)  Simvastatin 40 Mg Tabs (Simvastatin) .Marland Kitchen.. 1 1/2 Tab Once Daily 3)  Fexofenadine Hcl 180 Mg Tabs (Fexofenadine Hcl) .Marland Kitchen.. 1 Once Daily As Needed Allergies 4)  Baby Aspirin 81 Mg Chew (Aspirin) .... Once Daily 5)  Lexapro 20 Mg Tabs (Escitalopram Oxalate) .... Take One Tablet By Mouth Once A Day 6)  Meloxicam 7.5 Mg Tabs (Meloxicam) .... Take 1 Tablet By Mouth Once A Day 7)  Tramadol-Acetaminophen 37.5-325 Mg Tabs (Tramadol-Acetaminophen) .Marland Kitchen.. 1-2 Tabs By [facility] Every 4-6 Hours As  Needed  Allergies (verified): 1)  ! Penicillin  Comments:  Nurse/Medical Assistant: The patient's medications and allergies were reviewed with the patient and were updated in the Medication and Allergy Lists. Avon Gully CMA, Duncan Dull) (July 30, 2010 8:31 AM)  Physical Exam  General:  Well-developed,well-nourished,in no acute distress; alert,appropriate and cooperative throughout examination Head:  Normocephalic and atraumatic without obvious abnormalities. No apparent alopecia or balding. Eyes:  No corneal or conjunctival inflammation noted. EOMI. Perrla.  Ears:  External ear exam shows no significant lesions or deformities.  Otoscopic examination reveals clear canals, tympanic membranes are intact bilaterally without bulging, retraction, inflammation or discharge. Hearing is grossly normal bilaterally. Nose:  External nasal examination shows no deformity or inflammation. Nasal mucosa are pink and moist without lesions or exudates. Mouth:  Oral mucosa and oropharynx without lesions or exudates.  Teeth in good repair. Neck:  No deformities, masses, or tenderness noted. Lungs:  Normal respiratory effort, chest expands symmetrically. Lungs are clear to auscultation, no crackles or wheezes. Heart:  Normal rate and regular rhythm. S1 and S2 normal without gallop, murmur, click, rub or other extra sounds. Skin:  no rashes.   Cervical Nodes:  No lymphadenopathy noted Axillary Nodes:  No palpable lymphadenopathy   Impression & Recommendations:  Problem # 1:  SINUSITIS - ACUTE-NOS (ICD-461.9)  Her updated medication list for this problem includes:    Zithromax Z-pak 250 Mg Tabs (Azithromycin) .Marland Kitchen... Take as directed  Instructed on treatment. Call if symptoms persist or worsen.   Problem # 2:  HYPERTENSION, BENIGN (ICD-401.1)  Her updated medication list for this problem includes:    Losartan Potassium-hctz 50-12.5 Mg Tabs (Losartan potassium-hctz) .Marland Kitchen... Take 1 tablet by mouth once a  day  BP today: 136/78 Prior BP: 130/70 (01/27/2010)  Prior 10 Yr Risk Heart Disease: Not enough information (04/27/2009)  Labs Reviewed: K+: 4.2 (10/21/2009) Creat: : 0.85 (10/21/2009)   Chol: 208 (10/21/2009)   HDL: 72 (10/21/2009)   LDL: 111 (10/21/2009)   TG: 127 (10/21/2009)  Complete Medication List: 1)  Losartan Potassium-hctz 50-12.5 Mg Tabs (Losartan potassium-hctz) .... Take 1 tablet by mouth once a day 2)  Simvastatin 40 Mg Tabs (Simvastatin) .Marland Kitchen.. 1 1/2 tab once daily 3)  Fexofenadine Hcl 180 Mg Tabs (Fexofenadine hcl) .Marland Kitchen.. 1 once daily as needed allergies 4)  Baby Aspirin 81 Mg Chew (Aspirin) .... Once daily 5)  Lexapro 10 Mg Tabs (Escitalopram oxalate) .... Take 1 tablet by mouth once a day 6)  Meloxicam 7.5 Mg Tabs (Meloxicam) .... Take 1 tablet by mouth once a day 7)  Tramadol-acetaminophen 37.5-325 Mg Tabs (Tramadol-acetaminophen) .Marland Kitchen.. 1-2 tabs by mouthe every 4-6 hours as needed 8)  Zithromax Z-pak 250 Mg Tabs (Azithromycin) .... Take as directed  Hypertension Assessment/Plan:      The patient's hypertensive risk group is category B: At least one risk factor (excluding diabetes) with no target organ damage.  Her calculated 10 year risk of coronary heart disease is 6 %.  Today's blood pressure is 136/78.  Her blood pressure goal is < 140/90.   Patient Instructions: 1)  Take the lexapro 10mg  once day for a month and then decrease to every other day for 2 weeks and then stop.  2)  Call if not better in one week with your sinsues Prescriptions: MELOXICAM 7.5 MG TABS (MELOXICAM) Take 1 tablet by mouth once a day  #90 x 0   Entered and Authorized by:   [name]   Signed by:   [name] on 07/30/2010   Method used:   Electronically to        [facility]* (retail)             ,          Ph: [phone]       Fax: [phone]   RxID:   [id number] LEXAPRO 10 MG TABS (ESCITALOPRAM OXALATE) Take 1 tablet by mouth once a day  #90 x 0    Entered and Authorized by:   [name]   Signed by:   [name] on 07/30/2010   Method used:   Electronically to        [facility]* (retail)             ,          Ph: [phone]       Fax: [phone]   RxID:   [id number] LOSARTAN POTASSIUM-HCTZ 50-12.5 MG TABS (LOSARTAN POTASSIUM-HCTZ) Take 1 tablet by mouth once a day  #90 Tablet x 2   Entered and Authorized by:   [name]   Signed by:   [name] on 07/30/2010   Method used:   Electronically to        [facility]* (retail)             ,          Ph: [phone]  Fax: [phone]   RxID:   [id number] LOSARTAN POTASSIUM-HCTZ 50-12.5 MG TABS (LOSARTAN POTASSIUM-HCTZ) Take 1 tablet by mouth once a day  #90 Tablet x 2   Entered and Authorized by:   [name]   Signed by:   [name] on 07/30/2010   Method used:   Electronically to        [facility]* (retail)       [address] [place], [place]  [address]       Ph: [phone]       Fax: [phone]   RxID:   [id number] ZITHROMAX Z-PAK 250 MG TABS (AZITHROMYCIN) Take as directed  #1 pack x 0   Entered and Authorized by:   [name]   Signed by:   [name] on 07/30/2010   Method used:   Electronically to        [facility]* (retail)       [address] [place], [place]  [address]       Ph: [phone]       Fax: [phone]   RxID:   [id number]    Orders Added: 1)  Est. Patient Level III [94854]
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
## 2010-10-24 ENCOUNTER — Encounter: Payer: Self-pay | Admitting: Family Medicine

## 2010-10-27 ENCOUNTER — Ambulatory Visit (INDEPENDENT_AMBULATORY_CARE_PROVIDER_SITE_OTHER): Payer: BC Managed Care – PPO | Admitting: Family Medicine

## 2010-10-27 ENCOUNTER — Encounter: Payer: Self-pay | Admitting: Family Medicine

## 2010-10-27 DIAGNOSIS — J019 Acute sinusitis, unspecified: Secondary | ICD-10-CM

## 2010-10-27 DIAGNOSIS — I10 Essential (primary) hypertension: Secondary | ICD-10-CM

## 2010-10-27 DIAGNOSIS — R7309 Other abnormal glucose: Secondary | ICD-10-CM

## 2010-10-27 DIAGNOSIS — R7301 Impaired fasting glucose: Secondary | ICD-10-CM | POA: Insufficient documentation

## 2010-10-27 DIAGNOSIS — R5381 Other malaise: Secondary | ICD-10-CM

## 2010-10-27 DIAGNOSIS — E119 Type 2 diabetes mellitus without complications: Secondary | ICD-10-CM | POA: Insufficient documentation

## 2010-10-27 DIAGNOSIS — E785 Hyperlipidemia, unspecified: Secondary | ICD-10-CM

## 2010-10-27 DIAGNOSIS — E118 Type 2 diabetes mellitus with unspecified complications: Secondary | ICD-10-CM | POA: Insufficient documentation

## 2010-10-27 DIAGNOSIS — R0683 Snoring: Secondary | ICD-10-CM

## 2010-10-27 DIAGNOSIS — R0609 Other forms of dyspnea: Secondary | ICD-10-CM

## 2010-10-27 DIAGNOSIS — R5383 Other fatigue: Secondary | ICD-10-CM

## 2010-10-27 DIAGNOSIS — F3289 Other specified depressive episodes: Secondary | ICD-10-CM

## 2010-10-27 DIAGNOSIS — G4733 Obstructive sleep apnea (adult) (pediatric): Secondary | ICD-10-CM | POA: Insufficient documentation

## 2010-10-27 DIAGNOSIS — R0989 Other specified symptoms and signs involving the circulatory and respiratory systems: Secondary | ICD-10-CM

## 2010-10-27 DIAGNOSIS — F329 Major depressive disorder, single episode, unspecified: Secondary | ICD-10-CM

## 2010-10-27 HISTORY — DX: Type 2 diabetes mellitus with unspecified complications: E11.8

## 2010-10-27 MED ORDER — MELOXICAM 7.5 MG PO TABS: 7.5000 mg | ORAL_TABLET | Freq: Every day | ORAL | Status: DC

## 2010-10-27 MED ORDER — ESCITALOPRAM OXALATE 10 MG PO TABS: 10.0000 mg | ORAL_TABLET | Freq: Every day | ORAL | Status: DC

## 2010-10-27 NOTE — Assessment & Plan Note (Addendum)
PHQ-9 score of 13. She is clearly not well controlled. We will start with increasing her Lexapro back to 10 mg daily. If after 2 weeks she doesn't have significant symptom control she can go ahead and get back up to 20 mg daily. She does think she might have some 20 mg tablets left over at home. Followup in 4-6 weeks to make sure that she is improving on her new regimen.

## 2010-10-27 NOTE — Assessment & Plan Note (Addendum)
Her blood pressure is elevated today but she just took her medication 30 minutes ago Recheck in one month.

## 2010-10-27 NOTE — Progress Notes (Signed)
  Subjective:    Patient ID: Julia Woods, female    DOB: 1956/05/01, 55 y.o.   MRN: [id number]  HPI She she snores. Feels tired alll the time. Does have mood change.  No AM HA.  Husband had sleep apnea.  She is obese. Snores nightly. No SOB.    Feels apethetic.  Taking the lexapro 10mg  every other day. She has been more tearful. Has been more irrtable and depressed. No suicidal thoughts.  She has been sleeping well but thinks maybe sleeping too much.    Just took her BP pill right before she came. She says she thinks that is why her BP is elevated today. Normally she takes her medications regularly.  She is due for blood work today.  Review of Systems     Objective:   Physical Exam  Constitutional: She appears well-developed and well-nourished.  HENT:  Head: Normocephalic and atraumatic.  Cardiovascular: Normal rate and regular rhythm.   Pulmonary/Chest: Effort normal and breath sounds normal.  Musculoskeletal: She exhibits edema.       Right ankle trace edema.   Skin: Skin is warm and dry.  Psychiatric:       Tearful in the room    Assessment & Plan:

## 2010-10-27 NOTE — Assessment & Plan Note (Addendum)
The last couple glucose levels have been elevated. They have been predominantly in the prediabetic range. She is to recheck but I will also add an A1c.

## 2010-10-27 NOTE — Assessment & Plan Note (Addendum)
I did recommend that we schedule a sleep study for her. I think it is very possible that she has sleep apnea and this could be contributing to her fatigue as well as her mood disorder. Certainly she is obese and snores and has risk factors for sleep apnea.

## 2010-10-28 ENCOUNTER — Telehealth: Payer: Self-pay | Admitting: Family Medicine

## 2010-10-28 LAB — COMPLETE METABOLIC PANEL WITH GFR
ALT: 20 U/L (ref 0–35)
AST: 19 U/L (ref 0–37)
Albumin: 4.4 g/dL (ref 3.5–5.2)
Alkaline Phosphatase: 81 U/L (ref 39–117)
BUN: 23 mg/dL (ref 6–23)
CO2: 24 mEq/L (ref 19–32)
Calcium: 9.8 mg/dL (ref 8.4–10.5)
Chloride: 103 mEq/L (ref 96–112)
Creat: 0.89 mg/dL (ref 0.50–1.10)
GFR, Est African American: >60 mL/min (ref >60)
GFR, Est Non African American: >60 mL/min (ref >60)
Glucose, Bld: 101 mg/dL — ABNORMAL HIGH (ref 70–99)
Potassium: 4.3 mEq/L (ref 3.5–5.3)
Sodium: 139 mEq/L (ref 135–145)
Total Bilirubin: 0.9 mg/dL (ref 0.3–1.2)
Total Protein: 7 g/dL (ref 6.0–8.3)

## 2010-10-28 LAB — CBC
HCT: 44.6 % (ref 36.0–46.0)
Hemoglobin: 14.9 g/dL (ref 12.0–15.0)
MCH: 30.6 pg (ref 26.0–34.0)
MCHC: 33.4 g/dL (ref 30.0–36.0)
MCV: 91.6 fL (ref 78.0–100.0)
Platelets: 276 10*3/uL (ref 150–400)
RBC: 4.87 MIL/uL (ref 3.87–5.11)
RDW: 14.3 % (ref 11.5–15.5)
WBC: 5.6 10*3/uL (ref 4.0–10.5)

## 2010-10-28 LAB — LIPID PANEL
Cholesterol: 203 mg/dL — ABNORMAL HIGH (ref 0–200)
HDL: 71 mg/dL (ref >39)
LDL Cholesterol: 106 mg/dL — ABNORMAL HIGH (ref 0–99)
Total CHOL/HDL Ratio: 2.9 Ratio
Triglycerides: 132 mg/dL (ref <150)
VLDL: 26 mg/dL (ref 0–40)

## 2010-10-28 LAB — HEMOGLOBIN A1C
Hgb A1c MFr Bld: 5.5 % (ref <5.7)
Mean Plasma Glucose: 111 mg/dL (ref <117)

## 2010-10-28 NOTE — Telephone Encounter (Signed)
Call pt: Hgb is back up to normal!!! Cholesterol is about the same.  Glucose looks better. The a1c which tells [place] how well your sugars have been controlled looks great!  Recheck sugar in one year.

## 2010-10-28 NOTE — Telephone Encounter (Signed)
Pt advised of results. 

## 2010-11-23 ENCOUNTER — Telehealth: Payer: Self-pay | Admitting: Family Medicine

## 2010-11-23 DIAGNOSIS — G4733 Obstructive sleep apnea (adult) (pediatric): Secondary | ICD-10-CM

## 2010-11-23 NOTE — Telephone Encounter (Signed)
Call pt: Sleep study shows moderate sleep apnes. Her oxygen level dropped to 77% when she was sleeping. Needs to have a rpeat study with CPAP  (sleep mask) to get her titrated to the right pressure to treat her sleep apnea.  Also wt loss is recommend to help her apnea.

## 2010-11-24 ENCOUNTER — Ambulatory Visit (INDEPENDENT_AMBULATORY_CARE_PROVIDER_SITE_OTHER): Payer: BC Managed Care – PPO | Admitting: Family Medicine

## 2010-11-24 ENCOUNTER — Encounter: Payer: Self-pay | Admitting: Family Medicine

## 2010-11-24 DIAGNOSIS — I10 Essential (primary) hypertension: Secondary | ICD-10-CM

## 2010-11-24 DIAGNOSIS — F3289 Other specified depressive episodes: Secondary | ICD-10-CM

## 2010-11-24 DIAGNOSIS — F329 Major depressive disorder, single episode, unspecified: Secondary | ICD-10-CM

## 2010-11-24 DIAGNOSIS — G4733 Obstructive sleep apnea (adult) (pediatric): Secondary | ICD-10-CM

## 2010-11-24 DIAGNOSIS — E785 Hyperlipidemia, unspecified: Secondary | ICD-10-CM

## 2010-11-24 NOTE — Progress Notes (Signed)
  Subjective:    Patient ID: Julia Woods, female    DOB: [birth year], 55 y.o.   MRN: [id number]  HPI HTN - BP is  A little better. i showed her her weights and BP. No CP or SOB. She has been trying to lose weight by dietary changes but not real exercise  New dx Mod OSA - I reveiwed the results with her today. She is constantly fatigued and snores.   Mood - She does feel better taking the citalopram daily 10mg . She has been less tearful. She wants to continue her current dose. She is happy with the results.    Review of Systems     Objective:   Physical Exam  Constitutional: She is oriented to person, place, and time. She appears well-developed and well-nourished.  HENT:  Head: Normocephalic and atraumatic.  Eyes: Conjunctivae are normal.  Cardiovascular: Normal rate, regular rhythm and normal heart sounds.   Pulmonary/Chest: Effort normal and breath sounds normal.  Musculoskeletal: She exhibits no edema.  Neurological: She is alert and oriented to person, place, and time.  Skin: Skin is warm and dry.  Psychiatric: She has a normal mood and affect.          Assessment & Plan:  25 min spent face to face in counseling.

## 2010-11-24 NOTE — Assessment & Plan Note (Signed)
I showed her how her weight is integrally related to her BP. I really want her to get some exercise in and we discussed strategies to do this. BP just slightly better today.  Recheck in 6 weeks after starts her CPAP as well.

## 2010-11-24 NOTE — Assessment & Plan Note (Signed)
I discussed the result with her today and th eneed lfor second part  Of sleep study. We will schedule. She would like to have a consult with Dr. Gaetano Net as well.

## 2010-11-24 NOTE — Assessment & Plan Note (Signed)
I reviewed her choleserol results with her as well. At goal on her statin. Recheck in 1 yr.

## 2010-11-24 NOTE — Assessment & Plan Note (Signed)
PHQ- 9 score of 9 today which is improved.  Discussed option of increasing med but she wouldl ike to stay on current dose. F/U in 6 weeks.

## 2010-11-24 NOTE — Assessment & Plan Note (Signed)
Discussed results with her today. Will schedule forsecond half of sleep study. Hopefully this will help her mood and her ability to lose weight.

## 2010-11-24 NOTE — Telephone Encounter (Signed)
Pt notified during OV today.

## 2010-11-26 ENCOUNTER — Telehealth: Payer: Self-pay | Admitting: Family Medicine

## 2010-11-26 NOTE — Telephone Encounter (Signed)
Confirmed with the pt. Jarvis Newcomer, LPN [facility]

## 2010-11-26 NOTE — Telephone Encounter (Signed)
Pt calling to confirm her referral appt date/time.

## 2010-12-01 ENCOUNTER — Ambulatory Visit: Payer: BC Managed Care – PPO | Admitting: Obstetrics & Gynecology

## 2010-12-08 ENCOUNTER — Telehealth: Payer: Self-pay | Admitting: Family Medicine

## 2010-12-08 DIAGNOSIS — G4733 Obstructive sleep apnea (adult) (pediatric): Secondary | ICD-10-CM

## 2010-12-08 NOTE — Telephone Encounter (Signed)
Call pt: Sleep study shows needs CPAP set at 12 cm water pressure. We will arrange through home health. She will need to f/u with me in 8 weeks.

## 2010-12-08 NOTE — Telephone Encounter (Signed)
Pt.notified

## 2010-12-12 ENCOUNTER — Other Ambulatory Visit: Payer: Self-pay | Admitting: Family Medicine

## 2010-12-13 NOTE — Telephone Encounter (Signed)
Pt will need 6 week follow up appt around 01-05-11.

## 2010-12-15 ENCOUNTER — Encounter: Payer: Self-pay | Admitting: Family Medicine

## 2010-12-26 ENCOUNTER — Other Ambulatory Visit: Payer: Self-pay | Admitting: Family Medicine

## 2011-02-02 ENCOUNTER — Other Ambulatory Visit: Payer: Self-pay | Admitting: Obstetrics & Gynecology

## 2011-02-02 ENCOUNTER — Encounter: Payer: Self-pay | Admitting: Obstetrics & Gynecology

## 2011-02-02 ENCOUNTER — Encounter: Payer: Self-pay | Admitting: *Deleted

## 2011-02-02 ENCOUNTER — Ambulatory Visit (INDEPENDENT_AMBULATORY_CARE_PROVIDER_SITE_OTHER): Payer: BC Managed Care – PPO | Admitting: Obstetrics & Gynecology

## 2011-02-02 VITALS — BP 134/76 | HR 84 | Temp 98.6°F | Resp 16 | Ht 65.0 in | Wt 280.0 lb

## 2011-02-02 DIAGNOSIS — N9489 Other specified conditions associated with female genital organs and menstrual cycle: Secondary | ICD-10-CM

## 2011-02-02 DIAGNOSIS — Z01419 Encounter for gynecological examination (general) (routine) without abnormal findings: Secondary | ICD-10-CM

## 2011-02-02 NOTE — Patient Instructions (Signed)
  Place postmenopausal annual exam patient instructions here.  

## 2011-02-02 NOTE — Progress Notes (Signed)
  Subjective:    Julia Woods is a 55 y.o. female who presents for an annual exam. The patient has no complaints today.  GYN screening history: last pap: was normal. The patient wears seatbelts: yes. . The patient reports that there is not domestic violence in her life.   Menstrual History: OB History    Grav Para Term Preterm Abortions TAB SAB Ect Mult Living                  Menarche age: 62 Patient's last menstrual period was 11/13/2008.    The following portions of the patient's history were reviewed and updated as appropriate: allergies, current medications, past family history, past medical history, past social history, past surgical history and problem list.  Review of Systems Constitutional: negative Respiratory: negative Cardiovascular: negative Gastrointestinal: negative Genitourinary:negative Integument/breast: negative Musculoskeletal:negative Behavioral/Psych: negative    Objective:    General appearance: alert and cooperative Head: Normocephalic, without obvious abnormality, atraumatic Throat: lips, mucosa, and tongue normal; teeth and gums normal and Nml thryroid Back: negative, symmetric, no curvature. ROM normal. No CVA tenderness. Lungs: clear to auscultation bilaterally Breasts: normal appearance, no masses or tenderness, negative findings: skin normal Heart: regular rate and rhythm Abdomen: normal findings: no organomegaly and soft, non-tender Pelvic: no bladder tenderness, urethra without abnormality or discharge, uterus normal size, shape, and consistency and **right adnexa fill full / ? mass.* Extremities: extremities normal, atraumatic, no cyanosis or edema Skin: Skin color, texture, turgor normal. No rashes or lesions Neurologic: Grossly normal.    Assessment:    Healthy female exam.    Plan:    1.  Pap smear due in 2014 2.  Calcium supplementation and vit D 3.  Wt bearing exercise 4.  TV US for rt adnexal mass 5.  Colonoscopy uptodate per pt.   Ordered by primary care MD

## 2011-02-16 ENCOUNTER — Ambulatory Visit
Admission: RE | Admit: 2011-02-16 | Discharge: 2011-02-16 | Disposition: A | Discharge status: Home / Self Care | Payer: BC Managed Care – PPO | Source: Ambulatory Visit | Attending: Obstetrics & Gynecology | Admitting: Obstetrics & Gynecology

## 2011-02-16 DIAGNOSIS — N9489 Other specified conditions associated with female genital organs and menstrual cycle: Secondary | ICD-10-CM

## 2011-02-23 ENCOUNTER — Telehealth: Payer: Self-pay | Admitting: *Deleted

## 2011-02-23 NOTE — Telephone Encounter (Signed)
Pt notified that pelvic u/s on 02/16/11 was a normal study.

## 2011-03-15 ENCOUNTER — Ambulatory Visit (INDEPENDENT_AMBULATORY_CARE_PROVIDER_SITE_OTHER): Payer: BC Managed Care – PPO | Admitting: Family Medicine

## 2011-03-15 ENCOUNTER — Telehealth: Payer: Self-pay | Admitting: Family Medicine

## 2011-03-15 ENCOUNTER — Encounter: Payer: Self-pay | Admitting: Family Medicine

## 2011-03-15 VITALS — BP 123/66 | HR 70 | Wt 281.0 lb

## 2011-03-15 DIAGNOSIS — J069 Acute upper respiratory infection, unspecified: Secondary | ICD-10-CM

## 2011-03-15 DIAGNOSIS — G4733 Obstructive sleep apnea (adult) (pediatric): Secondary | ICD-10-CM

## 2011-03-15 DIAGNOSIS — E669 Obesity, unspecified: Secondary | ICD-10-CM

## 2011-03-15 DIAGNOSIS — I10 Essential (primary) hypertension: Secondary | ICD-10-CM

## 2011-03-15 MED ORDER — HYDROCHLOROTHIAZIDE 12.5 MG PO CAPS: 12.5000 mg | ORAL_CAPSULE | Freq: Every day | ORAL | Status: DC | PRN

## 2011-03-15 NOTE — Patient Instructions (Signed)

## 2011-03-15 NOTE — Telephone Encounter (Signed)
They picked up the chip but the chip when they downloaded it did not have anything on it so they sent the chip to different office for them to run it on a special computer and they are supposed to have it back tomorrow and hopefully they will have the results then. They will let us know then.Julia Woods said she is on a wireless modem device for her reads

## 2011-03-15 NOTE — Telephone Encounter (Signed)
Can you call [facility] about her CPAP and supplies. I haven't received her 8 week follow downloaded results. Pt says they picked up the chip about 4 weeks ago.  She has called them several times.

## 2011-03-15 NOTE — Progress Notes (Signed)
Subjective:    Patient ID: Julia Woods, female    DOB: 17-Nov-1955, 55 y.o.   MRN: [id number]  HPI  OSA -she was diagnosed with sleep apnea back in July. She was set up for home health to get her CPAP mask which she says she has been wearing. Says has been wearing her CPAP, every night.  She is sleeping better and says feels less fatigued. Her BP looks fantastic. No regular exercise. She has gained a few pounds. Says she feels more puffy and swollen. She has had great difficulty in getting in contact with the company. They did come and pick up a card from her machine after 8 weeks after she called multiple times.  URI for one week. Thought initially allergies but now sinus congestion or pressure. No fever. Mild cough.  Ears have had some discomfort.  Mild St from cough and raspy.  No Gi sxs.  Was taking Advil Cold and Sinus - helped some.   Review of Systems     BP 123/66  Pulse 70  Wt 281 lb (127.461 kg)  LMP 11/13/2008    Allergies  Allergen Reactions  . Penicillins Hives    Past Medical History  Diagnosis Date  . S/P breast biopsy 1990, 12-2007    benign/Dr. Dwain Sarna at [facility]  . Allergy     allergic rhinitis  . Hyperlipidemia   . Menopausal state     Past Surgical History  Procedure Date  . Breast biopsy 1990    8/09 benign/Dr Dwain Sarna @ [facility]  . Knee arthroscopy 07-2008    RT/Dr. Despina Hick  . Replacement total knee 03-2010    RT/Dr. Despina Hick  . Foot fracture surgery 11-2001    RT  . Ankle fracture surgery 10-2004    does have hardware/RT    History   Social History  . Marital Status: Married    Spouse Name: N/A    Number of Children: N/A  . Years of Education: N/A   Occupational History  . Not on file.   Social History Main Topics  . Smoking status: Never Smoker   . Smokeless tobacco: Not on file  . Alcohol Use: No  . Drug Use: No  . 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   Other Topics Concern  . Not on file   Social History Narrative  . No narrative on file    Family History  Problem Relation Age of Onset  . Cancer Mother 76    colon  . Diabetes Mother   . Stroke Mother   . Heart attack Mother   . Hyperlipidemia Mother   . Hypertension Mother   . Heart attack Father 28  . Hyperlipidemia      family history  . Hypertension      family history  . Stroke      1st degree relative < 50    [name] had no medications administered during this visit.  Objective:   Physical Exam  Constitutional: She is oriented to person, place, and time. She appears well-developed and well-nourished.  HENT:  Head: Normocephalic and atraumatic.  Right Ear: External ear normal.  Left Ear: External ear normal.  Nose: Nose normal.  Mouth/Throat: Oropharynx is clear and moist.       TMs and canals are clear. Does have some sinus tenderness over the maxillary sinuses.   Eyes: Conjunctivae and EOM are  normal. Pupils are equal, round, and reactive to light.  Neck: Neck supple. No thyromegaly present.  Cardiovascular: Normal rate, regular rhythm and normal heart sounds.   Pulmonary/Chest: Effort normal and breath sounds normal. She has no wheezes.  Lymphadenopathy:    She has no cervical adenopathy.  Neurological: She is alert and oriented to person, place, and time.  Skin: Skin is warm and dry.  Psychiatric: She has a normal mood and affect.          Assessment & Plan:  OSA- doing really well we'll continue her CPAP. I would like to download to make sure that she is being properly treated. I will have one of our staff to call the home health company to see if we can get this information. Not sure Y. she is having difficulty getting someone to call her back from the company.  URI- Call if not better by the end of the week. May be viral. If not better by the end of the week they will call in antibiotic and treat for sinusitis. Otherwise  she can continue with symptomatic care.  Obesity-I encouraged her to make sure she is eating healthfully and getting some regular exercise. She is in a couple more pounds since her last office visit and underwent this to be a trend. Patient agrees and says she will work on her.  Off her lexpro and doing well. Mood is stable.  She is getting her flu shot throught work next week.

## 2011-03-15 NOTE — Assessment & Plan Note (Signed)
Her blood pressure looks fantastic today.

## 2011-03-16 ENCOUNTER — Telehealth: Payer: Self-pay | Admitting: Family Medicine

## 2011-03-16 NOTE — Telephone Encounter (Signed)
Received call from Julia Woods and in regards to pt's compliance of her CPAP. Pt was seen our office yesterday.  Our office called [facility] about the pt's compliance of CPAP.  Ordered wireless modem this am so correspondence can be sent to our office sooner.  For whatever reason they had been sending correspondence to Dr. Beatris Ship office.  They sent [place] a fax of download from 01-19-2011----02-17-2011.  Pt 100% compliant.  Plan:  Routed this encounter to Dr. Marlyne Beards, LPN [facility]

## 2011-03-18 ENCOUNTER — Telehealth: Payer: Self-pay | Admitting: Family Medicine

## 2011-03-18 MED ORDER — AZITHROMYCIN 250 MG PO TABS: ORAL_TABLET | ORAL | Status: AC

## 2011-03-18 NOTE — Telephone Encounter (Signed)
Pt called and said she was seen by the provider Tuesday of this week and pt said she was told to call our office back if she didn't get to feeling better. Pt calling C/O today of + sinus pressure, and ears feel clogged.  No fever.  Pt states, "I feel bad."  Pt uses [facility] [place]. Plan:  Routed to Dr. Marlyne Beards, LPN [facility]

## 2011-03-18 NOTE — Telephone Encounter (Signed)
Pt not better. Will send in an ABX to treat for acute sinusitis.

## 2011-03-18 NOTE — Telephone Encounter (Signed)
Pt.notified

## 2011-04-15 ENCOUNTER — Other Ambulatory Visit: Payer: Self-pay | Admitting: Family Medicine

## 2011-05-24 ENCOUNTER — Other Ambulatory Visit: Payer: Self-pay | Admitting: Family Medicine

## 2011-06-14 ENCOUNTER — Other Ambulatory Visit: Payer: Self-pay | Admitting: Family Medicine

## 2011-06-14 DIAGNOSIS — Z1231 Encounter for screening mammogram for malignant neoplasm of breast: Secondary | ICD-10-CM

## 2011-07-01 ENCOUNTER — Other Ambulatory Visit: Payer: Self-pay | Admitting: Family Medicine

## 2011-07-03 ENCOUNTER — Encounter: Payer: Self-pay | Admitting: Emergency Medicine

## 2011-07-03 ENCOUNTER — Emergency Department
Admission: EM | Admit: 2011-07-03 | Discharge: 2011-07-03 | Disposition: A | Discharge status: Home / Self Care | Payer: BC Managed Care – PPO | Source: Home / Self Care | Attending: Family Medicine | Admitting: Family Medicine

## 2011-07-03 DIAGNOSIS — H6690 Otitis media, unspecified, unspecified ear: Secondary | ICD-10-CM

## 2011-07-03 DIAGNOSIS — H6692 Otitis media, unspecified, left ear: Secondary | ICD-10-CM

## 2011-07-03 DIAGNOSIS — H6590 Unspecified nonsuppurative otitis media, unspecified ear: Secondary | ICD-10-CM

## 2011-07-03 DIAGNOSIS — J069 Acute upper respiratory infection, unspecified: Secondary | ICD-10-CM

## 2011-07-03 DIAGNOSIS — H6591 Unspecified nonsuppurative otitis media, right ear: Secondary | ICD-10-CM

## 2011-07-03 MED ORDER — AZITHROMYCIN 250 MG PO TABS: ORAL_TABLET | ORAL | Status: AC

## 2011-07-03 NOTE — ED Notes (Signed)
Congestion with ear fullness sensation and hoarseness x 5 days. No recent OTCs.

## 2011-07-03 NOTE — ED Provider Notes (Signed)
History     CSN: [id number]  Arrival date & time 07/03/11  1327   First MD Initiated Contact with Patient 07/03/11 1457      Chief Complaint  Patient presents with  . Nasal Congestion     HPI Comments: Patient complains of approximately 5 day history of gradually progressive URI symptoms beginning with a mild sore throat (now improved), followed by progressive nasal congestion.  A cough started about 2 days ago.  Complains of fatigue and initial myalgias.  Cough is now worse at night and generally non-productive during the day.  There has been no pleuritic pain, shortness of breath, or wheezes.  She has now noticed decreased hearing in her right ear and sensation of both ears being clogged.  The history is provided by the patient.    Past Medical History  Diagnosis Date  . S/P breast biopsy 1990, 12-2007    benign/Dr. Dwain Sarna at [facility]  . Allergy     allergic rhinitis  . Hyperlipidemia   . Menopausal state     Past Surgical History  Procedure Date  . Breast biopsy 1990    8/09 benign/Dr Dwain Sarna @ [facility]  . Knee arthroscopy 07-2008    RT/Dr. Despina Hick  . Replacement total knee 03-2010    RT/Dr. Despina Hick  . Foot fracture surgery 11-2001    RT  . Ankle fracture surgery 10-2004    does have hardware/RT    Family History  Problem Relation Age of Onset  . Cancer Mother 2    colon  . Diabetes Mother   . Stroke Mother   . Heart attack Mother   . Hyperlipidemia Mother   . Hypertension Mother   . Heart attack Father 31  . Hyperlipidemia      family history  . Hypertension      family history  . Stroke      1st degree relative < 50    History  Substance Use Topics  . Smoking status: Never Smoker   . Smokeless tobacco: Not on file  . Alcohol Use: No    OB History    Grav Para Term Preterm Abortions TAB SAB Ect Mult Living   2 2        2       Review of Systems + sore throat + cough No pleuritic pain No wheezing + nasal congestion + post-nasal drainage No  sinus pain/pressure No itchy/red eyes + earache.  Ears feel clogged and hearing is decreased in right ear. No hemoptysis No SOB No fever, + chills/sweats No nausea No vomiting No abdominal pain No diarrhea No urinary symptoms No skin rashes + fatigue + myalgias No headache Used OTC meds without relief (Mucinex, Tylenol) Allergies  Penicillins  Home Medications   Current Outpatient Rx  Name Route Sig Dispense Refill  . ASPIRIN 81 MG PO CHEW Oral Chew 81 mg by mouth daily.      . AZITHROMYCIN 250 MG PO TABS  Take 2 tabs today; then begin one tab once daily for 4 more days. 6 each 0  . CALCIUM + D PO Oral Take by mouth.      Marland Kitchen GLUCOSAMINE-CHONDROITIN 500-400 MG PO TABS Oral Take 1 tablet by mouth 2 (two) times daily.      Marland Kitchen HYDROCHLOROTHIAZIDE 12.5 MG PO CAPS Oral Take 1 capsule (12.5 mg total) by mouth daily as needed. 30 capsule 2  . LOSARTAN POTASSIUM-HCTZ 50-12.5 MG PO TABS  TAKE 1 TABLET DAILY 90 tablet 1  .  MELOXICAM 7.5 MG PO TABS  TAKE 1 TABLET (7.5 MG TOTAL) DAILY 90 tablet 0  . ONE-DAILY MULTI VITAMINS PO TABS Oral Take 1 tablet by mouth daily.      Marland Kitchen SIMVASTATIN 40 MG PO TABS  Take  1(1/2) tabs of 40 mg tabs once daily.       BP 125/81  Pulse 67  Temp(Src) 98 F (36.7 C) (Oral)  Resp 18  Ht 5\' 5"  (1.651 m)  Wt 269 lb (122.018 kg)  BMI 44.76 kg/m2  SpO2 95%  LMP 11/13/2008  Physical Exam Nursing notes and Vital Signs reviewed. Appearance:  Patient appears obese but otherwise healthy, stated age, and in no acute distress Eyes:  Pupils are equal, round, and reactive to light and accomodation.  Extraocular movement is intact.  Conjunctivae are not inflamed  Ears:  Canals normal.   Left tympanic membrane is erythematous; right tympanic membrane has serous effusion Nose:  Mildly congested turbinates.  No sinus tenderness.   Pharynx:  Normal Neck:  Supple.  Slightly tender shotty posterior nodes are palpated bilaterally  Lungs:  Clear to auscultation.  Breath  sounds are equal.  Heart:  Regular rate and rhythm without murmurs, rubs, or gallops.  Abdomen:  Nontender without masses or hepatosplenomegaly.  Bowel sounds are present.  No CVA or flank tenderness.  Extremities:  No edema.  No calf tenderness Skin:  No rash present.   ED Course  Procedures none      1. Left otitis media   2. Right serous otitis media   3. Acute upper respiratory infections of unspecified site       MDM  Begin Z-pack. Continue Mucinex D (guaifenesin with decongestant) twice daily for congestion.  Increase fluid intake, rest. May use Afrin nasal spray (or generic oxymetazoline) twice daily for about 5 days.  Also recommend using saline nasal spray several times daily and saline nasal irrigation (AYR is a common brand) Stop all antihistamines for now, and other non-prescription cough/cold preparations. May take Delsym Cough Suppressant at bedtime for nighttime cough. Followup with PCP if not improving.          Donna Christen, MD 07/04/11 1432

## 2011-07-26 ENCOUNTER — Ambulatory Visit
Admission: RE | Admit: 2011-07-26 | Discharge: 2011-07-26 | Disposition: A | Discharge status: Home / Self Care | Payer: BC Managed Care – PPO | Source: Ambulatory Visit | Attending: Family Medicine | Admitting: Family Medicine

## 2011-07-26 DIAGNOSIS — Z1231 Encounter for screening mammogram for malignant neoplasm of breast: Secondary | ICD-10-CM

## 2011-09-13 ENCOUNTER — Encounter: Payer: Self-pay | Admitting: Family Medicine

## 2011-09-13 ENCOUNTER — Ambulatory Visit (INDEPENDENT_AMBULATORY_CARE_PROVIDER_SITE_OTHER): Payer: BC Managed Care – PPO | Admitting: Family Medicine

## 2011-09-13 VITALS — BP 123/68 | HR 95 | Ht 65.0 in | Wt 259.0 lb

## 2011-09-13 DIAGNOSIS — E785 Hyperlipidemia, unspecified: Secondary | ICD-10-CM

## 2011-09-13 DIAGNOSIS — I10 Essential (primary) hypertension: Secondary | ICD-10-CM

## 2011-09-13 LAB — BASIC METABOLIC PANEL WITH GFR
BUN: 23 mg/dL (ref 6–23)
CO2: 26 mEq/L (ref 19–32)
Calcium: 10 mg/dL (ref 8.4–10.5)
Chloride: 104 mEq/L (ref 96–112)
Creat: 1.01 mg/dL (ref 0.50–1.10)
GFR, Est African American: 71 mL/min
GFR, Est Non African American: 62 mL/min
Glucose, Bld: 93 mg/dL (ref 70–99)
Potassium: 3.8 mEq/L (ref 3.5–5.3)
Sodium: 141 mEq/L (ref 135–145)

## 2011-09-13 NOTE — Progress Notes (Signed)
  Subjective:    Patient ID: Julia Woods, female    DOB: 1955/11/16, 56 y.o.   MRN: [id number]  Hypertension This is a chronic problem. The current episode started more than 1 year ago. The problem is unchanged. The problem is controlled. Associated symptoms include blurred vision and peripheral edema. Pertinent negatives include no chest pain or shortness of breath. There are no associated agents to hypertension. Risk factors for coronary artery disease include no known risk factors. Past treatments include ACE inhibitors and diuretics. Compliance problems include exercise (having foot problems. ).    She has a post op shoe on the right foot for a fracture.    Review of Systems  Eyes: Positive for blurred vision.  Respiratory: Negative for shortness of breath.   Cardiovascular: Negative for chest pain.       Objective:   Physical Exam  Constitutional: She is oriented to person, place, and time. She appears well-developed and well-nourished.  HENT:  Head: Normocephalic and atraumatic.  Cardiovascular: Normal rate, regular rhythm and normal heart sounds.   Pulmonary/Chest: Effort normal and breath sounds normal.  Neurological: She is alert and oriented to person, place, and time.  Skin: Skin is warm and dry.  Psychiatric: She has a normal mood and affect. Her behavior is normal.          Assessment & Plan:  HTN - Controlled.  Check BMP today.  Looks fantastic. She said she lost 20 pounds with [employer]. Hopefully when she gets her foot fracture healed he can get back into exercising. Followup in 6 months. Check BMP today.  Hyperlipidemia -she is taking hur statin regularly. She denies any side effects such as myalgias. Recheck levels in August. She can call and we have to fax a lab slip down.

## 2011-09-13 NOTE — Progress Notes (Signed)
Quick Note:  All labs are normal. ______ 

## 2011-09-14 NOTE — Progress Notes (Signed)
Quick Note:  All labs are normal. ______ 

## 2011-09-20 ENCOUNTER — Other Ambulatory Visit: Payer: Self-pay | Admitting: *Deleted

## 2011-09-20 MED ORDER — SIMVASTATIN 40 MG PO TABS: ORAL_TABLET | ORAL | Status: DC

## 2011-09-22 ENCOUNTER — Other Ambulatory Visit: Payer: Self-pay | Admitting: Family Medicine

## 2011-11-16 ENCOUNTER — Other Ambulatory Visit: Payer: Self-pay | Admitting: Family Medicine

## 2011-11-24 ENCOUNTER — Telehealth: Payer: Self-pay | Admitting: *Deleted

## 2011-11-24 DIAGNOSIS — E785 Hyperlipidemia, unspecified: Secondary | ICD-10-CM

## 2011-12-05 ENCOUNTER — Encounter: Payer: Self-pay | Admitting: Family Medicine

## 2011-12-05 ENCOUNTER — Ambulatory Visit (INDEPENDENT_AMBULATORY_CARE_PROVIDER_SITE_OTHER): Payer: BC Managed Care – PPO | Admitting: Family Medicine

## 2011-12-05 ENCOUNTER — Ambulatory Visit: Payer: BC Managed Care – PPO | Admitting: Family Medicine

## 2011-12-05 VITALS — BP 111/71 | HR 76 | Wt 246.0 lb

## 2011-12-05 DIAGNOSIS — H109 Unspecified conjunctivitis: Secondary | ICD-10-CM

## 2011-12-05 MED ORDER — AZITHROMYCIN 1 % OP SOLN: OPHTHALMIC | Status: DC

## 2011-12-05 NOTE — Progress Notes (Signed)
  Subjective:    Patient ID: Julia Woods, female    DOB: [birth year], 56 y.o.   MRN: [id number]  HPI Left eye conjunctivitis for 4 days.  NO fever or URI sxs. Has been itchey and irritated. Some blurriness. No pain.  Using visine drops but not today. Julia Woods has it too.    Review of Systems     Objective:   Physical Exam  Constitutional: She appears well-developed and well-nourished.  HENT:  Head: Normocephalic and atraumatic.       Right and left with normal range of motion. Extra movements are intact. The conjunctiva is mildly inflamed and the sclera is injected on the right eye area and some mild scleral inflammation on the left eye she has clear serous drainage in the corner of the right eye. Upper and lower lids are mildly edematous. No erythema or sign of cellulitis.  Skin: Skin is warm.  Psychiatric: She has a normal mood and affect. Her behavior is normal.          Assessment & Plan:  Conjunctivitis - viral vs bacterial.  She's had it for 4 days and is now spreading to her left eye. We'll go ahead and treat her with azithromycin ophthalmic drops. These are expensive and we can consider changing her to erythromycin-ophthalmic ointment. Handout given. Throw away all contacts. Also make sure to get new eye makeup. Avoid touching the eye. Wash hands immediately. Call if she has a change in vision, get the eye pain, or the redness is not getting better in one week.

## 2011-12-05 NOTE — Patient Instructions (Signed)
Bacterial Conjunctivitis Conjunctivitis is an irritation (inflammation) of the clear membrane that covers the white part of the eye (conjunctiva). The irritation can also happen on the underside of the eyelids. Conjunctivitis makes the eye red or pink in color. This is what is commonly known as pink eye. CAUSES   Infection from a germ (bacteria) on the surface of the eye.   Infection from the irritation or injury of nearby tissues such as the eyelids or cornea.   More serious inflammation or infection on the inside of the eye.   Other eye diseases.   The use of certain eye medications.  SYMPTOMS  The normally white color of the eye or the underside of the eyelid is usually pink or red in color. The pink eye is usually associated with irritation, tearing and some sensitivity to light. Bacterial conjunctivitis is often associated with a thick, yellowish discharge from the eye. If a discharge is present, there may also be some blurred vision in the affected eye. DIAGNOSIS  Conjunctivitis is diagnosed by an eye exam. The eye specialist looks for changes in the surface tissues of the eye which take on changes that point to the specific type of conjunctivitis. A sample of any discharge may be collected on a Q-Tip (sterile swap). The sample will be sent to a lab to see whether or not the inflammation is caused by bacterial or viral infection. TREATMENT  Bacterial conjunctivitis is treated with medicines that kill germs (antibiotics). Drops are most often used. However, antibiotic ointments are available and may be preferred by some patients. Antibiotics by mouth (oral) are sometimes used. Artificial tears or eye washes may ease discomfort. HOME CARE INSTRUCTIONS   To ease discomfort, apply a cool, clean wash cloth to the eye for 10 to 20 minutes, 3 to 4 times a day.   Gently wipe away any drainage from the eye with a warm, wet washcloth or a cotton ball.   Wash your hands often with soap. Use paper  towels to dry.   Do not share towels or wash cloths. This may spread the infection.   Change or wash your pillow case every day.   You should not use eye make-up until the infection is gone.   Do not operate machinery or drive if vision is blurred.   Stop using contacts lenses. Ask your eye professional how to sterilize or replace them before using again. This depends on the type of contact lenses used.   Do not touch the edge of the eyelid with the eye drop bottle or ointment tube when applying medications to the affected eye. This will stop you from spreading the infection to the other eye or to others. Do as your caregiver tell you.  SEEK IMMEDIATE MEDICAL CARE IF:   The infection has not improved within 3 days of beginning treatment.   A yellow discharge from the eye develops.   Pain in the eye increases.   The redness is spreading.   Vision becomes blurred.   An oral temperature above 102 F (38.9 C) develops, or as your caregiver suggests.   Facial pain, redness or swelling develops.   Any problems that may be related to the prescribed medicine develops.  MAKE SURE YOU:   Understand these instructions.   Will watch your condition.   Will get help right away if you are not doing well or get worse.  Document Released: 05/02/2005 Document Revised: 04/21/2011 Document Reviewed: 12/20/2007 [employer] Patient Information 2012 [employer].   [employer] Patient Information 2012 [place].

## 2011-12-09 LAB — LIPID PANEL
Cholesterol: 172 mg/dL (ref 0–200)
HDL: 60 mg/dL (ref >39)
LDL Cholesterol: 96 mg/dL (ref 0–99)
Total CHOL/HDL Ratio: 2.9 Ratio
Triglycerides: 81 mg/dL (ref <150)
VLDL: 16 mg/dL (ref 0–40)

## 2012-01-26 ENCOUNTER — Other Ambulatory Visit: Payer: Self-pay | Admitting: Family Medicine

## 2012-02-24 ENCOUNTER — Other Ambulatory Visit: Payer: Self-pay | Admitting: Family Medicine

## 2012-03-14 ENCOUNTER — Encounter: Payer: Self-pay | Admitting: Family Medicine

## 2012-03-14 ENCOUNTER — Ambulatory Visit (INDEPENDENT_AMBULATORY_CARE_PROVIDER_SITE_OTHER): Payer: BC Managed Care – PPO | Admitting: Family Medicine

## 2012-03-14 VITALS — BP 111/66 | HR 81 | Ht 65.0 in | Wt 236.0 lb

## 2012-03-14 DIAGNOSIS — Z6839 Body mass index (BMI) 39.0-39.9, adult: Secondary | ICD-10-CM | POA: Insufficient documentation

## 2012-03-14 DIAGNOSIS — Z9989 Dependence on other enabling machines and devices: Secondary | ICD-10-CM

## 2012-03-14 DIAGNOSIS — E785 Hyperlipidemia, unspecified: Secondary | ICD-10-CM

## 2012-03-14 DIAGNOSIS — IMO0001 Reserved for inherently not codable concepts without codable children: Secondary | ICD-10-CM

## 2012-03-14 DIAGNOSIS — I10 Essential (primary) hypertension: Secondary | ICD-10-CM

## 2012-03-14 DIAGNOSIS — G4733 Obstructive sleep apnea (adult) (pediatric): Secondary | ICD-10-CM

## 2012-03-14 NOTE — Progress Notes (Signed)
  Subjective:    Patient ID: Julia Woods, female    DOB: January 30, 1956, 56 y.o.   MRN: [id number]  HPI HTN - No CP or SOB.  Has been doing weight watchers and has lost about 50lbs in the last year. Only takes 2nd HCTZ when she feels puffy. Eating low salt diet.  Walking for exercise.   Hyperlipidemia-tolerating simvastatin well without any problems or side effects. Getting regular exercise.  Having more frequent hotflashes.    OSA - Consider readjusting pressure s she loses more weight. She does try to wear it every night but says she does always wear it the entire night.  Review of Systems     Objective:   Physical Exam  Constitutional: She is oriented to person, place, and time. She appears well-developed and well-nourished.  HENT:  Head: Normocephalic and atraumatic.  Neck: Neck supple. No thyromegaly present.  Cardiovascular: Normal rate, regular rhythm and normal heart sounds.        No carotid bruits  Pulmonary/Chest: Effort normal and breath sounds normal.  Lymphadenopathy:    She has no cervical adenopathy.  Neurological: She is alert and oriented to person, place, and time.  Skin: Skin is warm and dry.  Psychiatric: She has a normal mood and affect. Her behavior is normal.          Assessment & Plan:  HTN - Doing fantastic. Continue current regimen. Followup in 6 months.  Hyperlpidemia - well controlled in July.  Recheck next summer. Continue current regimen. Lab Results  Component Value Date   CHOL 172 12/09/2011   HDL 60 12/09/2011   LDLCALC 96 12/09/2011   TRIG 81 12/09/2011   CHOLHDL 2.9 12/09/2011  Obstructive sleep apnea-consider getting her we titrated may be in the spring if she continues to lose weight. I suspect that her pressure needs will be less and this may make it more comfortable for her to wear her on a regular basis.

## 2012-03-20 ENCOUNTER — Ambulatory Visit: Payer: BC Managed Care – PPO | Admitting: Family Medicine

## 2012-04-19 ENCOUNTER — Telehealth: Payer: Self-pay

## 2012-04-19 NOTE — Telephone Encounter (Signed)
Printed and signed.  

## 2012-04-19 NOTE — Telephone Encounter (Signed)
I am unable to print order from letters. Please print and sign and then I will fax the order. Thank you  Fax number [phone] Attention [employer] supplies  Phone [phone]

## 2012-04-25 ENCOUNTER — Other Ambulatory Visit: Payer: Self-pay | Admitting: Family Medicine

## 2012-05-26 ENCOUNTER — Other Ambulatory Visit: Payer: Self-pay | Admitting: Family Medicine

## 2012-06-16 ENCOUNTER — Other Ambulatory Visit: Payer: Self-pay | Admitting: Family Medicine

## 2012-06-16 DIAGNOSIS — M5126 Other intervertebral disc displacement, lumbar region: Secondary | ICD-10-CM

## 2012-06-16 HISTORY — DX: Other intervertebral disc displacement, lumbar region: M51.26

## 2012-06-19 ENCOUNTER — Other Ambulatory Visit: Payer: Self-pay | Admitting: Family Medicine

## 2012-06-19 DIAGNOSIS — Z1231 Encounter for screening mammogram for malignant neoplasm of breast: Secondary | ICD-10-CM

## 2012-07-25 ENCOUNTER — Ambulatory Visit (INDEPENDENT_AMBULATORY_CARE_PROVIDER_SITE_OTHER): Payer: BC Managed Care – PPO | Admitting: Physician Assistant

## 2012-07-25 ENCOUNTER — Ambulatory Visit: Payer: BC Managed Care – PPO | Admitting: Family Medicine

## 2012-07-25 ENCOUNTER — Encounter: Payer: Self-pay | Admitting: Physician Assistant

## 2012-07-25 VITALS — BP 138/76 | HR 83 | Ht 65.0 in | Wt 237.0 lb

## 2012-07-25 DIAGNOSIS — F329 Major depressive disorder, single episode, unspecified: Secondary | ICD-10-CM

## 2012-07-25 DIAGNOSIS — F411 Generalized anxiety disorder: Secondary | ICD-10-CM

## 2012-07-25 DIAGNOSIS — F3289 Other specified depressive episodes: Secondary | ICD-10-CM

## 2012-07-25 DIAGNOSIS — F4323 Adjustment disorder with mixed anxiety and depressed mood: Secondary | ICD-10-CM

## 2012-07-25 MED ORDER — VENLAFAXINE HCL ER 37.5 MG PO CP24: 37.5000 mg | ORAL_CAPSULE | Freq: Every day | ORAL | Status: DC

## 2012-07-25 NOTE — Progress Notes (Signed)
  Subjective:    Patient ID: Julia Woods, female    DOB: [birth year], 57 y.o.   MRN: [id number]  HPI Patient presents to the clinic with anxiety, depression and crying all the time. Just after christmas she found out her son was going to be [place] 9 months to [place]. She is very upset and worried. She has found herself crying more and more as the day approaches. He has been deployed before but was on lexapro and did help. She is also having problems sleeping and wakes up in hot flashes. She also has hot flashed throughout the day too. She has more problem staying asleep than going to sleep. She denies any suicidal thoughts or harmful thoughts to others. She has also had a job change in November that has added to the stress of life. She does like her job.    Review of Systems     Objective:   Physical Exam  Constitutional: She is oriented to person, place, and time. She appears well-developed and well-nourished.  HENT:  Head: Normocephalic and atraumatic.  Cardiovascular: Normal rate, regular rhythm and normal heart sounds.   Pulmonary/Chest: Effort normal and breath sounds normal.  Neurological: She is alert and oriented to person, place, and time.  Skin:  Flushed cheeks.   Psychiatric:  Very tearful throughout interview.           Assessment & Plan:  Adjustment disorder/anxiety/depression/trouble sleeping/hot flashes- GAD-7 was 15 and PHQ-9 was 10. Decided to choose effexor since having hot flashes and mood issues. Sent to pharmacy. Discussed need for support group while son is deployed. Follow up with Dr. Judie Petit in 4-6 weeks. For sleeping discussed good bedtime routine with hot baths and relaxation techniques and to try melatonin 3mg  1 hour before bed can increase up to 10mg .  Pt aware that med takes 4ish weeks to get to optimal level and that I would advise her staying on while son is deployed.

## 2012-07-25 NOTE — Patient Instructions (Addendum)
Follow up in 4-6 weeks. Start effexor.   Melatonin 3mg  up to 10mg  1 hour before bed.

## 2012-07-26 ENCOUNTER — Ambulatory Visit: Payer: BC Managed Care – PPO

## 2012-07-26 ENCOUNTER — Ambulatory Visit (INDEPENDENT_AMBULATORY_CARE_PROVIDER_SITE_OTHER): Payer: BC Managed Care – PPO

## 2012-07-26 DIAGNOSIS — Z1231 Encounter for screening mammogram for malignant neoplasm of breast: Secondary | ICD-10-CM

## 2012-09-04 ENCOUNTER — Ambulatory Visit (INDEPENDENT_AMBULATORY_CARE_PROVIDER_SITE_OTHER): Payer: BC Managed Care – PPO | Admitting: Family Medicine

## 2012-09-04 ENCOUNTER — Encounter: Payer: Self-pay | Admitting: Family Medicine

## 2012-09-04 VITALS — BP 120/73 | HR 81 | Ht 65.0 in | Wt 242.0 lb

## 2012-09-04 DIAGNOSIS — F430 Acute stress reaction: Secondary | ICD-10-CM

## 2012-09-04 MED ORDER — VENLAFAXINE HCL ER 75 MG PO CP24: 75.0000 mg | ORAL_CAPSULE | Freq: Every day | ORAL | Status: DC

## 2012-09-04 NOTE — Progress Notes (Signed)
  Subjective:    Patient ID: Julia Woods, female    DOB: [birth year], 57 y.o.   MRN: [id number]  HPI Please see previous note. Patient recently was seen by my partner about 3-4 weeks ago for acute situational depression. Her son was deployed to [place] and she has had a hard time coping with this. She had previously been Lexepro,  for depression. She's also having some hot flashes they decided to start Effexor. She is here for followup today. Feels like the Effexor is helpful for mood and for hotflashes. No other S.E.  She is still. Tearful.  Says can't wear her CPAP when she gets hot at night.    She did want to update me. She did have a herniated disc in her lumbar spine back in February and did receive injection therapy at [facility]. She's actually much better. Review of Systems     Objective:   Physical Exam  Constitutional: She is oriented to person, place, and time. She appears well-developed and well-nourished.  HENT:  Head: Normocephalic and atraumatic.  Cardiovascular: Normal rate, regular rhythm and normal heart sounds.   Pulmonary/Chest: Effort normal and breath sounds normal.  Neurological: She is alert and oriented to person, place, and time.  Skin: Skin is warm and dry.  Psychiatric: She has a normal mood and affect. Her behavior is normal.          Assessment & Plan:  Acute situational depression-PHQ 9 score of 4. Gad 7 score of 2 today. Much improved.  She is feeling much better overall and want sto stay at current dose.  Followup in 8 weeks. If after a month she feels like she is still subtherapeutic on her treatment I encouraged her come back in sooner so that we can make adjust her regimen. She's currently taking 2 tabs at 37.5 mg we will change her to a 75 mg tabs additionally has to take 1 daily. Call if she feels that her mood is getting worse or she feels she is expensing any side effects on the medication.

## 2012-09-12 ENCOUNTER — Ambulatory Visit: Payer: BC Managed Care – PPO | Admitting: Family Medicine

## 2012-11-05 ENCOUNTER — Encounter: Payer: Self-pay | Admitting: Family Medicine

## 2012-11-05 ENCOUNTER — Ambulatory Visit (INDEPENDENT_AMBULATORY_CARE_PROVIDER_SITE_OTHER): Payer: BC Managed Care – PPO | Admitting: Family Medicine

## 2012-11-05 VITALS — BP 134/76 | HR 80 | Wt 253.0 lb

## 2012-11-05 DIAGNOSIS — I10 Essential (primary) hypertension: Secondary | ICD-10-CM

## 2012-11-05 DIAGNOSIS — F32A Depression, unspecified: Secondary | ICD-10-CM

## 2012-11-05 DIAGNOSIS — F329 Major depressive disorder, single episode, unspecified: Secondary | ICD-10-CM

## 2012-11-05 DIAGNOSIS — F3289 Other specified depressive episodes: Secondary | ICD-10-CM

## 2012-11-05 MED ORDER — VENLAFAXINE HCL ER 75 MG PO CP24: 75.0000 mg | ORAL_CAPSULE | Freq: Every day | ORAL | Status: DC

## 2012-11-05 NOTE — Progress Notes (Signed)
  Subjective:    Patient ID: Julia Woods, female    DOB: December 10, 1955, 57 y.o.   MRN: [id number]  HPI  F/U Depression - Says evern her hotflashes are better on te medication. She is sleeping well. No S.E. On the medication.  She feels better overall. No decline in mood.    HTN-  Pt denies chest pain, SOB, dizziness, or heart palpitations.  Taking meds as directed w/o problems.  Denies medication side effects.  5 min spent with pt.   Review of Systems     Objective:   Physical Exam  Constitutional: She is oriented to person, place, and time. She appears well-developed and well-nourished.  HENT:  Head: Normocephalic and atraumatic.  Cardiovascular: Normal rate, regular rhythm and normal heart sounds.   Pulmonary/Chest: Effort normal and breath sounds normal.  Neurological: She is alert and oriented to person, place, and time.  Skin: Skin is warm and dry.  Psychiatric: She has a normal mood and affect. Her behavior is normal.          Assessment & Plan:  Depression - Well controlle.d f/U in 4 months. Refills sent ot pharmacy. Call if any decline in mood.   HTN - well controlled.

## 2012-12-03 ENCOUNTER — Other Ambulatory Visit: Payer: Self-pay | Admitting: Family Medicine

## 2013-03-03 ENCOUNTER — Other Ambulatory Visit: Payer: Self-pay | Admitting: Family Medicine

## 2013-03-07 ENCOUNTER — Encounter: Payer: Self-pay | Admitting: Family Medicine

## 2013-03-07 ENCOUNTER — Ambulatory Visit (INDEPENDENT_AMBULATORY_CARE_PROVIDER_SITE_OTHER): Payer: BC Managed Care – PPO | Admitting: Family Medicine

## 2013-03-07 VITALS — BP 122/72 | HR 86 | Wt 267.0 lb

## 2013-03-07 DIAGNOSIS — F329 Major depressive disorder, single episode, unspecified: Secondary | ICD-10-CM

## 2013-03-07 DIAGNOSIS — Z1211 Encounter for screening for malignant neoplasm of colon: Secondary | ICD-10-CM

## 2013-03-07 DIAGNOSIS — R300 Dysuria: Secondary | ICD-10-CM

## 2013-03-07 DIAGNOSIS — I10 Essential (primary) hypertension: Secondary | ICD-10-CM

## 2013-03-07 DIAGNOSIS — F3289 Other specified depressive episodes: Secondary | ICD-10-CM

## 2013-03-07 LAB — POCT URINALYSIS DIPSTICK
Bilirubin, UA: NEGATIVE
Blood, UA: NEGATIVE
Glucose, UA: NEGATIVE
Ketones, UA: NEGATIVE
Nitrite, UA: NEGATIVE
Protein, UA: NEGATIVE
Spec Grav, UA: 1.02
Urobilinogen, UA: 1
pH, UA: 6

## 2013-03-07 NOTE — Progress Notes (Signed)
Subjective:    Patient ID: Julia Woods, female    DOB: 05-Dec-1955, 57 y.o.   MRN: [id number]  HPI Depression - Mood is well controlled. Had had cold the week and that has made her tired. Happy with current regimen. Has been more sleepy and has felt more tired than usual.    HTN- -  Pt denies chest pain, SOB, dizziness, or heart palpitations.  Taking meds as directed w/o problems.  Denies medication side effects.    dyruria on and off this week. No fever but has been more sweaty. No hematuria. No recent ABX use. She has chronic back pain but there is been no change in her back pain with her recent onset of symptoms.  Review of Systems  BP 122/72  Pulse 86  Wt 267 lb (121.11 kg)  BMI 44.43 kg/m2  LMP 11/13/2008    Allergies  Allergen Reactions  . Penicillins Hives    Past Medical History  Diagnosis Date  . S/P breast biopsy 1990, 12-2007    benign/Dr. Dwain Sarna at [facility]  . Allergy     allergic rhinitis  . Hyperlipidemia   . Menopausal state   . Lumbar herniated disc 06/2012    injection, [facility]    Past Surgical History  Procedure Laterality Date  . Breast biopsy  1990    8/09 benign/Dr Dwain Sarna @ [facility]  . Knee arthroscopy  07-2008    RT/Dr. Despina Hick  . Replacement total knee  03-2010    RT/Dr. Despina Hick  . Foot fracture surgery  11-2001    RT  . Ankle fracture surgery  10-2004    does have hardware/RT    History   Social History  . Marital Status: Married    Spouse Name: N/A    Number of Children: N/A  . Years of Education: N/A   Occupational History  . Not on file.   Social History Main Topics  . Smoking status: Never Smoker   . Smokeless tobacco: Not on file  . Alcohol Use: No  . Drug Use: No  . 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   Other Topics Concern  . Not on file   Social History Narrative  . No narrative on file    Family  History  Problem Relation Age of Onset  . Cancer Mother 35    colon  . Diabetes Mother   . Stroke Mother   . Heart attack Mother   . Hyperlipidemia Mother   . Hypertension Mother   . Heart attack Father 67  . Hyperlipidemia      family history  . Hypertension      family history  . Stroke      1st degree relative < 50    Outpatient Encounter Prescriptions as of 03/07/2013  Medication Sig Dispense Refill  . aspirin 81 MG chewable tablet Chew 81 mg by mouth daily.        . Calcium Carbonate-Vitamin D (CALCIUM + D PO) Take by mouth.        Marland Kitchen glucosamine-chondroitin 500-400 MG tablet Take 1 tablet by mouth 2 (two) times daily.        Marland Kitchen losartan-hydrochlorothiazide (HYZAAR) 50-12.5 MG per tablet TAKE 1 TABLET DAILY  90 tablet  0  . meloxicam (MOBIC) 7.5 MG tablet TAKE 1 TABLET DAILY  90 tablet  0  . Multiple Vitamin (MULTIVITAMIN) tablet Take 1  tablet by mouth daily.        . simvastatin (ZOCOR) 40 MG tablet TAKE ONE AND ONE-HALF TABLETS EVERY EVENING  135 tablet  0  . venlafaxine XR (EFFEXOR-XR) 75 MG 24 hr capsule Take 1 capsule (75 mg total) by mouth daily.  90 capsule  1  . hydrochlorothiazide (MICROZIDE) 12.5 MG capsule Take 1 capsule (12.5 mg total) by mouth daily as needed.  30 capsule  2   No facility-administered encounter medications on file as of 03/07/2013.          Objective:   Physical Exam  Constitutional: She is oriented to person, place, and time. She appears well-developed and well-nourished.  HENT:  Head: Normocephalic and atraumatic.  Cardiovascular: Normal rate, regular rhythm and normal heart sounds.   Pulmonary/Chest: Effort normal and breath sounds normal.  Neurological: She is alert and oriented to person, place, and time.  Skin: Skin is warm and dry.  Psychiatric: She has a normal mood and affect. Her behavior is normal.          Assessment & Plan:  Derpession- PHQ- 9 core of 3.  Well controlled. Patient is happy with current dose and  regimen. She's not consider weaning the medication at this time. Followup in 6 months.  HTN- well controlled. Continue current regimen. Followup in 6 months.  Dysuria - urinalysis positive for trace leukocytes. We'll send for culture for confirmation. She's afebrile.  Due for screening colonoscopy-will place order today. She prefers to stay here and [facility], [facility].

## 2013-03-08 LAB — URINE CULTURE
Colony Count: NO GROWTH
Organism ID, Bacteria: NO GROWTH

## 2013-05-08 ENCOUNTER — Encounter: Payer: Self-pay | Admitting: Family Medicine

## 2013-05-09 ENCOUNTER — Other Ambulatory Visit: Payer: Self-pay | Admitting: Family Medicine

## 2013-06-01 ENCOUNTER — Other Ambulatory Visit: Payer: Self-pay | Admitting: Family Medicine

## 2013-06-21 ENCOUNTER — Other Ambulatory Visit: Payer: Self-pay | Admitting: Family Medicine

## 2013-06-21 DIAGNOSIS — Z1231 Encounter for screening mammogram for malignant neoplasm of breast: Secondary | ICD-10-CM

## 2013-07-10 ENCOUNTER — Other Ambulatory Visit: Payer: Self-pay | Admitting: Family Medicine

## 2013-07-16 ENCOUNTER — Other Ambulatory Visit: Payer: Self-pay | Admitting: Family Medicine

## 2013-07-30 ENCOUNTER — Ambulatory Visit: Payer: BC Managed Care – PPO

## 2013-08-01 ENCOUNTER — Ambulatory Visit (INDEPENDENT_AMBULATORY_CARE_PROVIDER_SITE_OTHER): Payer: BC Managed Care – PPO

## 2013-08-01 DIAGNOSIS — Z1231 Encounter for screening mammogram for malignant neoplasm of breast: Secondary | ICD-10-CM

## 2013-08-09 ENCOUNTER — Other Ambulatory Visit: Payer: Self-pay | Admitting: Family Medicine

## 2013-08-14 ENCOUNTER — Encounter: Payer: Self-pay | Admitting: Physician Assistant

## 2013-08-14 ENCOUNTER — Ambulatory Visit (INDEPENDENT_AMBULATORY_CARE_PROVIDER_SITE_OTHER): Payer: BC Managed Care – PPO | Admitting: Physician Assistant

## 2013-08-14 VITALS — BP 123/77 | HR 82 | Ht 65.0 in | Wt 277.0 lb

## 2013-08-14 DIAGNOSIS — A499 Bacterial infection, unspecified: Secondary | ICD-10-CM

## 2013-08-14 DIAGNOSIS — B9689 Other specified bacterial agents as the cause of diseases classified elsewhere: Secondary | ICD-10-CM

## 2013-08-14 DIAGNOSIS — J329 Chronic sinusitis, unspecified: Secondary | ICD-10-CM

## 2013-08-14 MED ORDER — AZITHROMYCIN 250 MG PO TABS: ORAL_TABLET | ORAL | Status: DC

## 2013-08-14 MED ORDER — METHYLPREDNISOLONE SODIUM SUCC 125 MG IJ SOLR
125.0000 mg | Freq: Once | INTRAMUSCULAR | Status: AC
  Administered 2013-08-14: 125 mg via INTRAMUSCULAR

## 2013-08-14 NOTE — Progress Notes (Signed)
   Subjective:    Patient ID: Julia Woods, female    DOB: 09-23-1955, 58 y.o.   MRN: [id number]  HPI Pt is a 58 yo female who presents to the clinic with 2 weeks of upper respiratory symptoms. The patient that she started to fill better and then got worse again. She has a dry cough. She constantly feels drainage coming down her throat. She has tremendous sinus pressure. She feels worn out. She has been hot and then cold but denies any fever. Her right ear feels very achy. She's tried Sudafed cold and cough with minimal relief. No one else in her house is sick. Denies any nausea, vomiting, wheezing or shortness of breath.   Review of Systems     Objective:   Physical Exam  Constitutional: She is oriented to person, place, and time. She appears well-developed and well-nourished.  HENT:  Head: Normocephalic and atraumatic.  TMs injected bilaterally. No blood or pus.  Maxillary sinuses tender to palpation bilaterally.  Bilateral turbinates red and swollen.  Oropharynx erythematous with postnasal drip present. No tonsillar enlargement or exudate.  Eyes: Conjunctivae are normal. Right eye exhibits no discharge. Left eye exhibits no discharge.  Neck: Normal range of motion. Neck supple.  Cardiovascular: Normal rate, regular rhythm and normal heart sounds.   Pulmonary/Chest: Effort normal and breath sounds normal. She has no wheezes.  Lymphadenopathy:    She has no cervical adenopathy.  Neurological: She is alert and oriented to person, place, and time.  Psychiatric: She has a normal mood and affect. Her behavior is normal.          Assessment & Plan:  Bacterial sinusitis/eustachian tube dysfunction or right ear- treated patient with Z-Pak since pt has penicillin allergy. Shot of Solu-Medrol 125 mg IM was given in office today. Discussed over-the-counter Flonase or Nasacort to help with ear pain and drainage. Gave patient handout for sinusitis and discuss other symptomatic treatment. Call  if not improving.

## 2013-08-14 NOTE — Patient Instructions (Signed)

## 2013-08-27 ENCOUNTER — Ambulatory Visit (INDEPENDENT_AMBULATORY_CARE_PROVIDER_SITE_OTHER): Payer: BC Managed Care – PPO | Admitting: Obstetrics & Gynecology

## 2013-08-27 ENCOUNTER — Encounter: Payer: Self-pay | Admitting: Obstetrics & Gynecology

## 2013-08-27 VITALS — BP 137/86 | HR 93 | Temp 98.2°F | Resp 18 | Ht 65.0 in | Wt 276.0 lb

## 2013-08-27 DIAGNOSIS — Z124 Encounter for screening for malignant neoplasm of cervix: Secondary | ICD-10-CM

## 2013-08-27 DIAGNOSIS — Z1151 Encounter for screening for human papillomavirus (HPV): Secondary | ICD-10-CM

## 2013-08-27 DIAGNOSIS — Z01419 Encounter for gynecological examination (general) (routine) without abnormal findings: Secondary | ICD-10-CM

## 2013-08-27 DIAGNOSIS — N951 Menopausal and female climacteric states: Secondary | ICD-10-CM

## 2013-08-27 NOTE — Progress Notes (Signed)
  Subjective:    Julia Woods is a 58 y.o. female who presents for an annual exam. The patient has no complaints today. The patient is sexually active. GYN screening history: last pap: was normal and last mammogram: was normal. The patient wears seatbelts: yes. The patient participates in regular exercise: some but not enough per patient. Has the patient ever been transfused or tattooed?: no. The patient reports that there is not domestic violence in her life. Pt has some hot flashes and night sweats.  Better on Effexor.  Does not desire HRT at this time.  No PMB.  No problems.  Menstrual History: OB History   Grav Para Term Preterm Abortions TAB SAB Ect Mult Living   2 2        2      Patient's last menstrual period was 11/13/2008.    The following portions of the patient's history were reviewed and updated as appropriate: allergies, current medications, past family history, past medical history, past social history, past surgical history and problem list.  Review of Systems Pertinent items are noted in HPI.    Objective:      Filed Vitals:   08/27/13 1441  BP: 137/86  Pulse: 93  Temp: 98.2 F (36.8 C)  Resp: 18  Height: 5\' 5"  (1.651 m)  Weight: 276 lb (125.193 kg)  SpO2: 96%   Vitals:  WNL General appearance: alert, cooperative and no distress Head: Normocephalic, without obvious abnormality, atraumatic Eyes: negative Throat: lips, mucosa, and tongue normal; teeth and gums normal Lungs: clear to auscultation bilaterally Breasts: normal appearance, no masses or tenderness, No nipple retraction or dimpling, No nipple discharge or bleeding Heart: regular rate and rhythm Abdomen: soft, non-tender; bowel sounds normal; no masses,  no organomegaly  Pelvic:  External Genitalia:  Tanner V, no lesion Urethra:  No prolapse Vagina:  Atrophic, no lesion, no discharge Cervix:  No CMT, no lesion Uterus:  Normal size and contour, non tender Adnexa:  Normal, no masses, non tender  (limited by habitus) Rectovaginal Septum:  Non tender, no masses  Extremities: no edema, redness or tenderness in the calves or thighs Skin: no lesions or rash Lymph nodes: Axillary adenopathy: none     .    Assessment:    Healthy female exam.    Plan:     Thin prep Pap smear. with co testing--next pap due in 5 years Mammogram up to date Cont CA nd Vit D Bone density testing. Nml colonoscopy in 2014 RTC 1 year

## 2013-08-27 NOTE — Addendum Note (Signed)
Addended by: Granville LewisLARK, Manvir Thorson L on: 08/27/2013 04:47 PM   Modules accepted: Orders

## 2013-09-03 ENCOUNTER — Ambulatory Visit (INDEPENDENT_AMBULATORY_CARE_PROVIDER_SITE_OTHER): Payer: BC Managed Care – PPO

## 2013-09-03 DIAGNOSIS — Z13820 Encounter for screening for osteoporosis: Secondary | ICD-10-CM

## 2013-09-03 DIAGNOSIS — N951 Menopausal and female climacteric states: Secondary | ICD-10-CM

## 2013-09-04 ENCOUNTER — Ambulatory Visit (INDEPENDENT_AMBULATORY_CARE_PROVIDER_SITE_OTHER): Payer: BC Managed Care – PPO | Admitting: Family Medicine

## 2013-09-04 ENCOUNTER — Encounter: Payer: Self-pay | Admitting: Family Medicine

## 2013-09-04 VITALS — BP 138/64 | HR 82 | Ht 64.6 in | Wt 278.0 lb

## 2013-09-04 DIAGNOSIS — R7309 Other abnormal glucose: Secondary | ICD-10-CM

## 2013-09-04 DIAGNOSIS — M25473 Effusion, unspecified ankle: Secondary | ICD-10-CM

## 2013-09-04 DIAGNOSIS — Z23 Encounter for immunization: Secondary | ICD-10-CM

## 2013-09-04 DIAGNOSIS — E042 Nontoxic multinodular goiter: Secondary | ICD-10-CM

## 2013-09-04 DIAGNOSIS — E785 Hyperlipidemia, unspecified: Secondary | ICD-10-CM

## 2013-09-04 DIAGNOSIS — M25476 Effusion, unspecified foot: Secondary | ICD-10-CM

## 2013-09-04 DIAGNOSIS — I10 Essential (primary) hypertension: Secondary | ICD-10-CM

## 2013-09-04 DIAGNOSIS — M25471 Effusion, right ankle: Secondary | ICD-10-CM

## 2013-09-04 MED ORDER — VENLAFAXINE HCL ER 75 MG PO CP24: ORAL_CAPSULE | ORAL | Status: DC

## 2013-09-04 MED ORDER — ZOSTER VACCINE LIVE 19400 UNT/0.65ML ~~LOC~~ SOLR
0.6500 mL | Freq: Once | SUBCUTANEOUS | Status: DC
Start: 2013-09-04 — End: 2013-09-04

## 2013-09-04 MED ORDER — VALACYCLOVIR HCL 1 G PO TABS: 1000.0000 mg | ORAL_TABLET | Freq: Two times a day (BID) | ORAL | Status: DC | PRN

## 2013-09-04 NOTE — Progress Notes (Signed)
   Subjective:    Patient ID: Julia Woods, female    DOB: 1956-01-05, 58 y.o.   MRN: [id number]  HPI Hypertension- Pt denies chest pain, SOB, dizziness, or heart palpitations.  Taking meds as directed w/o problems.  Denies medication side effects.    Depression followup- Well controlled.  Happy with current regimen.  Needs refills. Sleeping well   Hyperlipidemia-currently on simvastatin 40 mg daily.  tolreaitng well with no side effects or myalgia.   Review of Systems     Objective:   Physical Exam  Constitutional: She is oriented to person, place, and time. She appears well-developed and well-nourished.  HENT:  Head: Normocephalic and atraumatic.  Cardiovascular: Normal rate, regular rhythm and normal heart sounds.   Pulmonary/Chest: Effort normal and breath sounds normal.  Neurological: She is alert and oriented to person, place, and time.  Skin: Skin is warm and dry.  Psychiatric: She has a normal mood and affect. Her behavior is normal.          Assessment & Plan:  Hypertension-well-controlled. Continue current regimen. Followup in 6 months.  Depression-PHQ 9 score of 1. She marked the question for low energy.  Hyperlipidemia-due to repeat lipid panel.

## 2013-09-08 ENCOUNTER — Other Ambulatory Visit: Payer: Self-pay | Admitting: Family Medicine

## 2013-09-20 LAB — COMPLETE METABOLIC PANEL WITH GFR
ALT: 27 U/L (ref 0–35)
AST: 22 U/L (ref 0–37)
Albumin: 4.1 g/dL (ref 3.5–5.2)
Alkaline Phosphatase: 79 U/L (ref 39–117)
BUN: 20 mg/dL (ref 6–23)
CO2: 26 mEq/L (ref 19–32)
Calcium: 9.4 mg/dL (ref 8.4–10.5)
Chloride: 105 mEq/L (ref 96–112)
Creat: 0.87 mg/dL (ref 0.50–1.10)
GFR, Est African American: 84 mL/min
GFR, Est Non African American: 73 mL/min
Glucose, Bld: 97 mg/dL (ref 70–99)
Potassium: 3.8 mEq/L (ref 3.5–5.3)
Sodium: 141 mEq/L (ref 135–145)
Total Bilirubin: 0.8 mg/dL (ref 0.2–1.2)
Total Protein: 6.5 g/dL (ref 6.0–8.3)

## 2013-09-20 LAB — HEMOGLOBIN A1C
Hgb A1c MFr Bld: 5.9 % — ABNORMAL HIGH (ref <5.7)
Mean Plasma Glucose: 123 mg/dL — ABNORMAL HIGH (ref <117)

## 2013-09-20 LAB — TSH: TSH: 1.766 u[IU]/mL (ref 0.350–4.500)

## 2013-09-20 LAB — LIPID PANEL
Cholesterol: 181 mg/dL (ref 0–200)
HDL: 73 mg/dL (ref >39)
LDL Cholesterol: 81 mg/dL (ref 0–99)
Total CHOL/HDL Ratio: 2.5 Ratio
Triglycerides: 134 mg/dL (ref <150)
VLDL: 27 mg/dL (ref 0–40)

## 2013-09-22 ENCOUNTER — Encounter: Payer: Self-pay | Admitting: Family Medicine

## 2013-11-07 ENCOUNTER — Other Ambulatory Visit: Payer: Self-pay | Admitting: Family Medicine

## 2013-11-08 ENCOUNTER — Other Ambulatory Visit: Payer: Self-pay | Admitting: Family Medicine

## 2014-01-12 ENCOUNTER — Other Ambulatory Visit: Payer: Self-pay | Admitting: Family Medicine

## 2014-02-05 ENCOUNTER — Other Ambulatory Visit: Payer: Self-pay | Admitting: Family Medicine

## 2014-03-10 ENCOUNTER — Encounter: Payer: Self-pay | Admitting: Family Medicine

## 2014-03-10 ENCOUNTER — Ambulatory Visit (INDEPENDENT_AMBULATORY_CARE_PROVIDER_SITE_OTHER): Payer: BC Managed Care – PPO | Admitting: Family Medicine

## 2014-03-10 DIAGNOSIS — R7301 Impaired fasting glucose: Secondary | ICD-10-CM

## 2014-03-10 DIAGNOSIS — R7309 Other abnormal glucose: Secondary | ICD-10-CM

## 2014-03-10 DIAGNOSIS — F3342 Major depressive disorder, recurrent, in full remission: Secondary | ICD-10-CM

## 2014-03-10 DIAGNOSIS — E66813 Obesity, class 3: Secondary | ICD-10-CM

## 2014-03-10 LAB — POCT GLYCOSYLATED HEMOGLOBIN (HGB A1C): Hemoglobin A1C: 5.6

## 2014-03-10 MED ORDER — PHENTERMINE-TOPIRAMATE ER 3.75-23 MG PO CP24: 1.0000 | ORAL_CAPSULE | Freq: Every day | ORAL | Status: DC

## 2014-03-10 MED ORDER — LOSARTAN POTASSIUM-HCTZ 100-25 MG PO TABS: 1.0000 | ORAL_TABLET | Freq: Every day | ORAL | Status: DC

## 2014-03-10 NOTE — Patient Instructions (Signed)
Smart phone application called My Fitness pal

## 2014-03-10 NOTE — Progress Notes (Signed)
   Subjective:    Patient ID: Julia Woods, female    DOB: 1955/11/27, 58 y.o.   MRN: [id number]  Hypertension   Hypertension- Pt denies chest pain, SOB, dizziness, or heart palpitations.  Taking meds as directed w/o problems.  Denies medication side effects.  Her weight is up 3 lbs.    Depression - ON Effexor. Doing well and stable.    IFG - No inc thirst or urination.   Has ben trying to walk at lunch. Says with weight watcher would get almost "consumed".   Review of Systems     Objective:   Physical Exam  Constitutional: She is oriented to person, place, and time. She appears well-developed and well-nourished.  HENT:  Head: Normocephalic and atraumatic.  Cardiovascular: Normal rate, regular rhythm and normal heart sounds.   Pulmonary/Chest: Effort normal and breath sounds normal.  Neurological: She is alert and oriented to person, place, and time.  Skin: Skin is warm and dry.  Psychiatric: She has a normal mood and affect. Her behavior is normal.          Assessment & Plan:  Depression-stable. No changes. Continue Effexor.  HTN - Not well controlled today. Will increase her losartan/HCTZ to 100/5mg .   IFG - much improved. Great work.  Lab Results  Component Value Date   HGBA1C 5.6 03/10/2014    BMI greater than 40-had a long discussion today about setting some goals for weight loss. She is okay with a nutrition referral. We'll also have her start the smart phone application called my fitness spell to help her set some weight loss goals. Also discussed weight locations as an option. She is interested in doing this. We'll start Qsymia. Warned about potential side effects. Will give her the first 2 weeks for free and if she tolerates this well we can send a prescription for the higher dose.

## 2014-03-17 ENCOUNTER — Encounter: Payer: Self-pay | Admitting: Family Medicine

## 2014-03-21 ENCOUNTER — Other Ambulatory Visit: Payer: Self-pay | Admitting: Family Medicine

## 2014-03-25 ENCOUNTER — Other Ambulatory Visit: Payer: Self-pay | Admitting: Family Medicine

## 2014-03-25 ENCOUNTER — Other Ambulatory Visit: Payer: Self-pay

## 2014-03-25 MED ORDER — PHENTERMINE-TOPIRAMATE ER 3.75-23 MG PO CP24: 1.0000 | ORAL_CAPSULE | Freq: Every day | ORAL | Status: DC

## 2014-03-25 NOTE — Telephone Encounter (Signed)
Refilled the Qsymia without an appointment due to the fact she just finished the 14 day trial.

## 2014-04-13 ENCOUNTER — Other Ambulatory Visit: Payer: Self-pay | Admitting: Family Medicine

## 2014-04-21 ENCOUNTER — Ambulatory Visit (INDEPENDENT_AMBULATORY_CARE_PROVIDER_SITE_OTHER): Payer: BC Managed Care – PPO | Admitting: Family Medicine

## 2014-04-21 ENCOUNTER — Encounter: Payer: Self-pay | Admitting: Family Medicine

## 2014-04-21 VITALS — BP 107/65 | HR 97 | Ht 64.5 in | Wt 275.0 lb

## 2014-04-21 DIAGNOSIS — G4733 Obstructive sleep apnea (adult) (pediatric): Secondary | ICD-10-CM | POA: Diagnosis not present

## 2014-04-21 DIAGNOSIS — R635 Abnormal weight gain: Secondary | ICD-10-CM | POA: Diagnosis not present

## 2014-04-21 DIAGNOSIS — J0140 Acute pansinusitis, unspecified: Secondary | ICD-10-CM | POA: Diagnosis not present

## 2014-04-21 DIAGNOSIS — R031 Nonspecific low blood-pressure reading: Secondary | ICD-10-CM | POA: Diagnosis not present

## 2014-04-21 MED ORDER — PHENTERMINE-TOPIRAMATE ER 3.75-23 MG PO CP24: 1.0000 | ORAL_CAPSULE | Freq: Every day | ORAL | Status: DC

## 2014-04-21 MED ORDER — AMBULATORY NON FORMULARY MEDICATION: Status: DC

## 2014-04-21 MED ORDER — AZITHROMYCIN 250 MG PO TABS: ORAL_TABLET | ORAL | Status: DC

## 2014-04-21 MED ORDER — PHENTERMINE-TOPIRAMATE ER 7.5-46 MG PO CP24: 1.0000 | ORAL_CAPSULE | Freq: Every day | ORAL | Status: DC

## 2014-04-21 NOTE — Progress Notes (Signed)
   Subjective:    Patient ID: Julia Woods, female    DOB: 1955/08/19, 58 y.o.   MRN: [id number]  HPI Abnormal weight gain - Has appt with nutrition later this month. She is cutting back but not counting calories.  Trying to get some exercise.  Has been walking and riding stationary bike. Says the Qsymia is curbing her appetite.    1 week of nasal congestion and pressure( approx 9 days).  Slight cough.  No fever, chills, or sweat. Using miucinex at night.  Cough at night.  + Post nasal pressure.  Bilat frontal and maxillary pain    Obstructive sleep apnea-she's had difficulty getting supplies from her company. She's only gotten one new mask in the last 3 years. She wants and if we might be able to get her supplies elsewhere. Her sleep [facility] in 2012. We do have a copy scanned into the chart.  Review of Systems     Objective:   Physical Exam  Constitutional: She is oriented to person, place, and time. She appears well-developed and well-nourished.  HENT:  Head: Normocephalic and atraumatic.  Right Ear: External ear normal.  Left Ear: External ear normal.  Nose: Nose normal.  Mouth/Throat: Oropharynx is clear and moist.  TMs and canals are clear.   Eyes: Conjunctivae and EOM are normal. Pupils are equal, round, and reactive to light.  Neck: Neck supple. No thyromegaly present.  Cardiovascular: Normal rate, regular rhythm and normal heart sounds.   Pulmonary/Chest: Effort normal and breath sounds normal. She has no wheezes.  Lymphadenopathy:    She has no cervical adenopathy.  Neurological: She is alert and oriented to person, place, and time.  Skin: Skin is warm and dry.  Psychiatric: She has a normal mood and affect.          Assessment & Plan:  Abnormal Weight gain - Has lost 6 lbs.  Recommend weight 1-2 times per week.  Work on staying hydration.  Will increase Qsymia dose to 7.5mg .  F/U in 1 mo for nurse BP and weight check   OSA-- needs new  CPAP supplies from [facility].  [facility] can provide her supplies.   Acute sinusitis - will tx with zpack bc of PCN allergy. Call if not better in one week.  HTN- blood pressure is a little bit low today. She will monitor at work over the next couple weeks. If it continues to stay low and she will call me back and we can adjust her blood pressure medication.

## 2014-04-21 NOTE — Patient Instructions (Signed)

## 2014-05-22 ENCOUNTER — Ambulatory Visit (INDEPENDENT_AMBULATORY_CARE_PROVIDER_SITE_OTHER): Payer: BLUE CROSS/BLUE SHIELD | Admitting: Family Medicine

## 2014-05-22 VITALS — BP 123/67 | HR 95 | Ht 64.5 in | Wt 273.0 lb

## 2014-05-22 DIAGNOSIS — R635 Abnormal weight gain: Secondary | ICD-10-CM | POA: Diagnosis not present

## 2014-05-22 MED ORDER — PHENTERMINE-TOPIRAMATE ER 11.25-69 MG PO CP24: 1.0000 | ORAL_CAPSULE | Freq: Every day | ORAL | Status: DC

## 2014-05-22 NOTE — Progress Notes (Signed)
   Subjective:    Patient ID: Julia Woods, female    DOB: [birth year], 59 y.o.   MRN: [id number] Pt in today for weight/bp check.  She is only down 2lbs from last visit. I told her that I would let you decide if a refill on her qsymia was ok.  She wanted you to know that she's checked her bp at work a couple times & on 12-22 it was 116/66 & on 12-30 it was 120/71.  She also wanted to know if you needed her to f/u with you.  Donne AnonAmber Fay Bagg, CMA HPI    Review of Systems     Objective:   Physical Exam        Assessment & Plan:  Abnormal weight gain-we will refill the Qsymia at higher dose. Follow-up with me in one month.

## 2014-05-27 ENCOUNTER — Telehealth: Payer: Self-pay | Admitting: Family Medicine

## 2014-05-27 NOTE — Telephone Encounter (Signed)
Was it the higher dose of 7.5 that was more expensive or has she been paying this the whole time? We can always try low-dose phentermine instead that we would have to monitor blood pressure very carefully because it's a lot more than what is in the Qsymia.

## 2014-05-27 NOTE — Telephone Encounter (Signed)
Qsymia is too expensive for patient  And the cost is $146.45, could you recommend something cheaper.

## 2014-05-28 NOTE — Telephone Encounter (Signed)
Faxed insurance card to pharmacy.

## 2014-05-30 MED ORDER — PHENTERMINE HCL 15 MG PO CAPS: 15.0000 mg | ORAL_CAPSULE | ORAL | Status: DC

## 2014-05-30 NOTE — Telephone Encounter (Signed)
The change was the insurance company changed formulary and the Qsymia is no longer covered. She is fine with the low dose of the phentermine.

## 2014-06-17 ENCOUNTER — Ambulatory Visit (INDEPENDENT_AMBULATORY_CARE_PROVIDER_SITE_OTHER): Payer: BLUE CROSS/BLUE SHIELD | Admitting: Family Medicine

## 2014-06-17 ENCOUNTER — Encounter: Payer: Self-pay | Admitting: Family Medicine

## 2014-06-17 VITALS — BP 115/71 | HR 106 | Ht 64.5 in | Wt 272.0 lb

## 2014-06-17 DIAGNOSIS — R635 Abnormal weight gain: Secondary | ICD-10-CM | POA: Diagnosis not present

## 2014-06-17 NOTE — Patient Instructions (Addendum)
Could consider:  Saxenda (shot) Contrave ( slower to kick in) Belviq.

## 2014-06-17 NOTE — Progress Notes (Signed)
   Subjective:    Patient ID: Julia Woods, female    DOB: 10/02/1955, 59 y.o.   MRN: [id number]  HPI Abnormal weight gain - Says started the phentermine about 1.5 weeks ago. Notices some hand and feet tingling and more headaches than usual. She did check her blood pressure at work one day while having a headache and her BP was 139/76.  Qsymia was no longer covered with her insurance as she had to stop it.  Review of Systems     Objective:   Physical Exam  Constitutional: She is oriented to person, place, and time. She appears well-developed and well-nourished.  HENT:  Head: Normocephalic and atraumatic.  Cardiovascular: Normal rate, regular rhythm and normal heart sounds.   Pulmonary/Chest: Effort normal and breath sounds normal.  Neurological: She is alert and oriented to person, place, and time.  Skin: Skin is warm and dry.  Psychiatric: She has a normal mood and affect. Her behavior is normal.          Assessment & Plan:  Abnormal weight gain-she's only been on the phentermine for about one half weeks that has lost 1 pound according to our scale. She's not sure what she weighs on her home scale. We discussed different options and she is having some side effects. Her blood pressure looks great but her pulse is up a little bit. On repeat check her pulse was in the 90s. She wants to continue the low-dose phentermine for now and then think about other options. I wrote down the names of the Domingo Pulsecontrave, Sexenda, and Belviq for her to investigate with her insurance. She will call and let me know in a month which she would like to do. Continue to work on diet and exercise.

## 2014-06-19 ENCOUNTER — Other Ambulatory Visit: Payer: Self-pay | Admitting: Family Medicine

## 2014-06-19 NOTE — Telephone Encounter (Signed)
Due for a f/u around April 2016

## 2014-06-30 ENCOUNTER — Other Ambulatory Visit: Payer: Self-pay | Admitting: Family Medicine

## 2014-06-30 DIAGNOSIS — Z9289 Personal history of other medical treatment: Secondary | ICD-10-CM

## 2014-07-12 ENCOUNTER — Other Ambulatory Visit: Payer: Self-pay | Admitting: Family Medicine

## 2014-07-25 ENCOUNTER — Other Ambulatory Visit: Payer: Self-pay

## 2014-07-25 MED ORDER — SIMVASTATIN 40 MG PO TABS: 40.0000 mg | ORAL_TABLET | Freq: Every day | ORAL | Status: DC

## 2014-08-06 ENCOUNTER — Ambulatory Visit (INDEPENDENT_AMBULATORY_CARE_PROVIDER_SITE_OTHER): Payer: BLUE CROSS/BLUE SHIELD

## 2014-08-06 DIAGNOSIS — Z9289 Personal history of other medical treatment: Secondary | ICD-10-CM

## 2014-08-06 DIAGNOSIS — Z1231 Encounter for screening mammogram for malignant neoplasm of breast: Secondary | ICD-10-CM | POA: Diagnosis not present

## 2014-08-14 ENCOUNTER — Other Ambulatory Visit: Payer: Self-pay | Admitting: Family Medicine

## 2014-09-17 ENCOUNTER — Other Ambulatory Visit: Payer: Self-pay | Admitting: Family Medicine

## 2014-09-22 ENCOUNTER — Encounter: Payer: Self-pay | Admitting: Family Medicine

## 2014-09-22 ENCOUNTER — Ambulatory Visit (INDEPENDENT_AMBULATORY_CARE_PROVIDER_SITE_OTHER): Payer: BLUE CROSS/BLUE SHIELD | Admitting: Family Medicine

## 2014-09-22 VITALS — BP 124/74 | HR 100 | Wt 275.0 lb

## 2014-09-22 DIAGNOSIS — I10 Essential (primary) hypertension: Secondary | ICD-10-CM | POA: Diagnosis not present

## 2014-09-22 DIAGNOSIS — R7301 Impaired fasting glucose: Secondary | ICD-10-CM

## 2014-09-22 DIAGNOSIS — E785 Hyperlipidemia, unspecified: Secondary | ICD-10-CM

## 2014-09-22 LAB — POCT GLYCOSYLATED HEMOGLOBIN (HGB A1C): Hemoglobin A1C: 5.5

## 2014-09-22 MED ORDER — PHENTERMINE HCL 37.5 MG PO CAPS: 37.5000 mg | ORAL_CAPSULE | ORAL | Status: DC

## 2014-09-22 NOTE — Progress Notes (Signed)
   Subjective:    Patient ID: Julia Woods,Julia Woods female    DOB: 11-03-1955, 59 y.o.   MRN: [id number]  HPI Hypertension- Pt denies chest pain, SOB, dizziness, or heart palpitations.  Taking meds as directed w/o problems.  Denies medication side effects.    Abnormal weight gain - She thought it maybe the phentermine might be giving her S.E.  She would like to retry it. She has gained 3 lbs since last here 2 months ago.     Review of Systems     Objective:   Physical Exam  Constitutional: She is oriented to person, place, and time. She appears well-developed and well-nourished.  HENT:  Head: Normocephalic and atraumatic.  Cardiovascular: Normal rate, regular rhythm and normal heart sounds.   Pulmonary/Chest: Effort normal and breath sounds normal.  Neurological: She is alert and oriented to person, place, and time.  Skin: Skin is warm and dry.  Psychiatric: She has a normal mood and affect. Her behavior is normal.          Assessment & Plan:  HTN - well controlled. Continue current regime.  F/U in 6 mo.  IFG  - hemoglobin A1c is 5.5 today and looks fantastic. Recommend repeat in one year.  Abnormal weight gain - we discussed different options. She would like to retry the phentermine and see if she's able to tolerate it. She thought it might be causing headaches previously but wasn't sure so discontinued it. She actually also only lost 1 pound when she was taking the 15 mg dose but says she is wanting to try the 37.5 mg dose. Monitor for any side effects. Will need to follow-up monthly for blood pressure and weight check.

## 2014-10-04 ENCOUNTER — Other Ambulatory Visit: Payer: Self-pay | Admitting: Family Medicine

## 2014-10-20 ENCOUNTER — Ambulatory Visit (INDEPENDENT_AMBULATORY_CARE_PROVIDER_SITE_OTHER): Payer: BLUE CROSS/BLUE SHIELD | Admitting: Family Medicine

## 2014-10-20 VITALS — BP 120/76 | HR 102 | Wt 272.0 lb

## 2014-10-20 DIAGNOSIS — R635 Abnormal weight gain: Secondary | ICD-10-CM

## 2014-10-20 MED ORDER — PHENTERMINE HCL 37.5 MG PO CAPS: 37.5000 mg | ORAL_CAPSULE | ORAL | Status: DC

## 2014-10-20 NOTE — Progress Notes (Signed)
   Subjective:    Patient ID: Julia Woods, female    DOB: 1956-02-21, 59 y.o.   MRN: [id number]  HPI Patient is here for blood pressure and weight check. Denies any trouble sleeping, palpitations, or any other medication problems. Patient states she has not made any major changes to her diet/exerice routine yet but plants too. Pt also plans to go get her lab work completed on 10/27/14.   Review of Systems     Objective:   Physical Exam        Assessment & Plan:  Patient has lost weight. A refill for Phentermine will be sent to patient preferred pharmacy. Patient advised to schedule a four week nurse visit and keep her upcoming appointment with her PCP. Verbalized understanding, no further questions.  Has lost 3 lbs. Continue regimen.   Julia Gasseratherine Metheney, MD

## 2014-10-27 ENCOUNTER — Other Ambulatory Visit: Payer: Self-pay | Admitting: Family Medicine

## 2014-10-28 ENCOUNTER — Other Ambulatory Visit: Payer: Self-pay | Admitting: Family Medicine

## 2014-10-28 LAB — COMPLETE METABOLIC PANEL WITH GFR
ALT: 24 U/L (ref 0–35)
AST: 21 U/L (ref 0–37)
Albumin: 4.3 g/dL (ref 3.5–5.2)
Alkaline Phosphatase: 82 U/L (ref 39–117)
BUN: 24 mg/dL — ABNORMAL HIGH (ref 6–23)
CO2: 28 mEq/L (ref 19–32)
Calcium: 9.8 mg/dL (ref 8.4–10.5)
Chloride: 102 mEq/L (ref 96–112)
Creat: 0.99 mg/dL (ref 0.50–1.10)
GFR, Est African American: 72 mL/min
GFR, Est Non African American: 62 mL/min
Glucose, Bld: 96 mg/dL (ref 70–99)
Potassium: 3.8 mEq/L (ref 3.5–5.3)
Sodium: 140 mEq/L (ref 135–145)
Total Bilirubin: 0.7 mg/dL (ref 0.2–1.2)
Total Protein: 7.2 g/dL (ref 6.0–8.3)

## 2014-10-28 LAB — LIPID PANEL
Cholesterol: 205 mg/dL — ABNORMAL HIGH (ref 0–200)
HDL: 79 mg/dL (ref >=46)
LDL Cholesterol: 104 mg/dL — ABNORMAL HIGH (ref 0–99)
Total CHOL/HDL Ratio: 2.6 Ratio
Triglycerides: 110 mg/dL (ref <150)
VLDL: 22 mg/dL (ref 0–40)

## 2014-10-28 MED ORDER — SIMVASTATIN 40 MG PO TABS: ORAL_TABLET | ORAL | Status: DC

## 2014-11-12 ENCOUNTER — Other Ambulatory Visit: Payer: Self-pay | Admitting: Family Medicine

## 2014-11-28 ENCOUNTER — Ambulatory Visit (INDEPENDENT_AMBULATORY_CARE_PROVIDER_SITE_OTHER): Payer: BLUE CROSS/BLUE SHIELD | Admitting: Family Medicine

## 2014-11-28 VITALS — BP 133/80 | HR 107 | Wt 268.0 lb

## 2014-11-28 DIAGNOSIS — R635 Abnormal weight gain: Secondary | ICD-10-CM | POA: Diagnosis not present

## 2014-11-28 MED ORDER — PHENTERMINE HCL 37.5 MG PO CAPS: 37.5000 mg | ORAL_CAPSULE | ORAL | Status: DC

## 2014-11-28 NOTE — Progress Notes (Signed)
   Subjective:    Patient ID: Julia Woods, female    DOB: 19-Mar-1956, 59 y.o.   MRN: [id number]  HPI    Review of Systems     Objective:   Physical Exam        Assessment & Plan:  Abnormal weight gain-has lost 4 pounds and is doing well on the medication. Blood pressure is at goal. Follow-up in one month for PCP.  Nani Gasseratherine Ervin Rothbauer, MD

## 2014-11-28 NOTE — Progress Notes (Signed)
   Subjective:    Patient ID: Julia Woods, female    DOB: 02-Sep-1955, 59 y.o.   MRN: [id number]  HPI Patient is here for blood pressure and weight check. Denies any trouble sleeping, palpitations, or any other medication problems.    Review of Systems     Objective:   Physical Exam        Assessment & Plan:  Patient has lost weight. A refill for Phentermine will be sent to patient preferred pharmacy. Patient advised to schedule a four week nurse visit and keep her upcoming appointment with her PCP. Verbalized understanding, no further questions.

## 2014-12-15 ENCOUNTER — Other Ambulatory Visit: Payer: Self-pay | Admitting: Family Medicine

## 2014-12-30 ENCOUNTER — Ambulatory Visit (INDEPENDENT_AMBULATORY_CARE_PROVIDER_SITE_OTHER): Payer: BLUE CROSS/BLUE SHIELD | Admitting: Family Medicine

## 2014-12-30 VITALS — BP 146/82 | HR 114 | Wt 264.0 lb

## 2014-12-30 DIAGNOSIS — R635 Abnormal weight gain: Secondary | ICD-10-CM | POA: Diagnosis not present

## 2014-12-30 MED ORDER — PHENTERMINE HCL 37.5 MG PO CAPS: 37.5000 mg | ORAL_CAPSULE | ORAL | Status: DC

## 2014-12-30 NOTE — Progress Notes (Signed)
   Subjective:    Patient ID: Julia Woods, female    DOB: 1956-01-17, 59 y.o.   MRN: [id number]  HPI  Patient is here for blood pressure and weight check. Denies any trouble sleeping, palpitations, or any other medication problems. Pt states she has felt like her heart has been racing some lately, and her pulse was elevated in office today. Pt's BP was also elevated, Pt reports taking her BP Rx's about prior to coming into office. Pt has been doing some walking in the evenings but not as much as normal based on the heat outside.   Review of Systems     Objective:   Physical Exam        Assessment & Plan:  Patient has lost weight. A refill for Phentermine will be sent to patient preferred pharmacy. Patient advised to schedule a four week nurse visit and keep her upcoming appointment with her PCP. Verbalized understanding, no further questions.

## 2014-12-30 NOTE — Progress Notes (Signed)
   Subjective:    Patient ID: Julia Woods, female    DOB: [birth year], 59 y.o.   MRN: [id number]  HPI    Review of Systems     Objective:   Physical Exam        Assessment & Plan:  Abnormal weight gain - has lost 4 lbs. Doing well.  F/U in 1 months.   Nani Gasser, MD

## 2015-01-06 ENCOUNTER — Other Ambulatory Visit: Payer: Self-pay | Admitting: Family Medicine

## 2015-01-30 ENCOUNTER — Ambulatory Visit (INDEPENDENT_AMBULATORY_CARE_PROVIDER_SITE_OTHER): Payer: BLUE CROSS/BLUE SHIELD

## 2015-01-30 ENCOUNTER — Other Ambulatory Visit: Payer: Self-pay | Admitting: Family Medicine

## 2015-01-30 ENCOUNTER — Ambulatory Visit (INDEPENDENT_AMBULATORY_CARE_PROVIDER_SITE_OTHER): Payer: BLUE CROSS/BLUE SHIELD | Admitting: Family Medicine

## 2015-01-30 ENCOUNTER — Encounter: Payer: Self-pay | Admitting: Family Medicine

## 2015-01-30 VITALS — BP 121/71 | HR 92 | Temp 97.7°F | Ht 64.5 in | Wt 260.0 lb

## 2015-01-30 DIAGNOSIS — R635 Abnormal weight gain: Secondary | ICD-10-CM | POA: Diagnosis not present

## 2015-01-30 DIAGNOSIS — R208 Other disturbances of skin sensation: Secondary | ICD-10-CM | POA: Diagnosis not present

## 2015-01-30 DIAGNOSIS — R200 Anesthesia of skin: Secondary | ICD-10-CM

## 2015-01-30 DIAGNOSIS — R5383 Other fatigue: Secondary | ICD-10-CM

## 2015-01-30 DIAGNOSIS — I10 Essential (primary) hypertension: Secondary | ICD-10-CM

## 2015-01-30 DIAGNOSIS — G4700 Insomnia, unspecified: Secondary | ICD-10-CM

## 2015-01-30 DIAGNOSIS — R002 Palpitations: Secondary | ICD-10-CM

## 2015-01-30 DIAGNOSIS — M25571 Pain in right ankle and joints of right foot: Secondary | ICD-10-CM

## 2015-01-30 DIAGNOSIS — Z23 Encounter for immunization: Secondary | ICD-10-CM | POA: Diagnosis not present

## 2015-01-30 MED ORDER — PHENTERMINE HCL 15 MG PO CAPS: 15.0000 mg | ORAL_CAPSULE | ORAL | Status: DC

## 2015-01-30 MED ORDER — VALACYCLOVIR HCL 1 G PO TABS: 1000.0000 mg | ORAL_TABLET | Freq: Two times a day (BID) | ORAL | Status: DC | PRN

## 2015-01-30 NOTE — Progress Notes (Signed)
Subjective:    Patient ID: Julia Woods, female    DOB: 1956/05/02, 59 y.o.   MRN: [id number]  HPI  abnormal weight gain-here today to follow-up for weight loss and phentermine. She's been tolerating it well without any side effects or problems. No chest pain, palpitations etc. Within working on weight loss since about last October. She has lost a most 20 pounds since then. She hs lost 2 more lbs. Hasn't been exercising as much.  Hasn't been sleeping as well and has had some heart flutters. She retired at the end of the spring and has had a harder time stayin gon a sleep schedule.   Hypertension- Pt denies chest pain, SOB, dizziness, or heart palpitations.  Taking meds as directed w/o problems.  Denies medication side effects.    Numbness on the right pinky toe and feels like walking a sock along the pad of the foot. Started about 2 weeks. No back pain.  No trauma or injury.  No ankle pain.   Review of Systems BP 121/71 mmHg  Pulse 92  Temp(Src) 97.7 F (36.5 C)  Ht 5' 4.5" (1.638 m)  Wt 260 lb (117.935 kg)  BMI 43.96 kg/m2  LMP 11/13/2008    Allergies  Allergen Reactions  . Penicillins Hives    Past Medical History  Diagnosis Date  . S/P breast biopsy 1990, 12-2007    benign/Dr. Dwain Sarna at [facility]  . Allergy     allergic rhinitis  . Hyperlipidemia   . Menopausal state   . Lumbar herniated disc 06/2012    injection, [facility]  . Hypertension   . Mental disorder     depression  . Sleep apnea     Past Surgical History  Procedure Laterality Date  . Breast biopsy  1990    8/09 benign/Dr Dwain Sarna @ [facility]  . Knee arthroscopy  07-2008    RT/Dr. Despina Hick  . Replacement total knee  03-2010    RT/Dr. Despina Hick  . Foot fracture surgery  11-2001    RT  . Ankle fracture surgery  10-2004    does have hardware/RT  . Steroid injection to scar      lumbar spine    Social History   Social History  . Marital Status: Married    Spouse Name: N/A  . Number of Children: N/A  . Years of  Education: N/A   Occupational History  . Not on file.   Social History Main Topics  . Smoking status: Never Smoker   . Smokeless tobacco: Not on file  . Alcohol Use: No  . Drug Use: No  . 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   Other Topics Concern  . Not on file   Social History Narrative    Family History  Problem Relation Age of Onset  . Cancer Mother 74    colon  . Diabetes Mother   . Stroke Mother   . Heart attack Mother   . Hyperlipidemia Mother   . Hypertension Mother   . Heart attack Father 21  . Hyperlipidemia      family history  . Hypertension      family history  . Stroke      1st degree relative < 50    Outpatient Encounter Prescriptions as of 01/30/2015  Medication Sig  . AMBULATORY NON FORMULARY MEDICATION Medication Name: CPAP supplies including tubing, filter, humidifier, mask.  See copy of study done 3 years ago.  She already has a CPAP machine. Dx OSA - G47.33  . aspirin 81 MG chewable tablet Chew 81 mg by mouth daily.    Marland Kitchen losartan-hydrochlorothiazide (HYZAAR) 100-25 MG per tablet Take 1 tablet by mouth daily.  . meloxicam (MOBIC) 7.5 MG tablet TAKE 1 TABLET DAILY  . Multiple Vitamin (MULTIVITAMIN) tablet Take 1 tablet by mouth daily.    . phentermine 15 MG capsule Take 1 capsule (15 mg total) by mouth every morning.  . simvastatin (ZOCOR) 40 MG tablet TAKE 1 TABLET DAILY AT bedtime  . valACYclovir (VALTREX) 1000 MG tablet Take 1 tablet (1,000 mg total) by mouth 2 (two) times daily as needed.  . venlafaxine XR (EFFEXOR-XR) 75 MG 24 hr capsule TAKE 1 CAPSULE DAILY  . [DISCONTINUED] phentermine 37.5 MG capsule Take 1 capsule (37.5 mg total) by mouth every morning.  . [DISCONTINUED] valACYclovir (VALTREX) 1000 MG tablet Take 1 tablet (1,000 mg total) by mouth 2 (two) times daily as needed.   No facility-administered encounter  medications on file as of 01/30/2015.          Objective:   Physical Exam  Constitutional: She is oriented to person, place, and time. She appears well-developed and well-nourished.  HENT:  Head: Normocephalic and atraumatic.  Cardiovascular: Normal rate, regular rhythm and normal heart sounds.   Pulmonary/Chest: Effort normal and breath sounds normal.  Neurological: She is alert and oriented to person, place, and time.  Right foot is normal visually. No swelling. Old surgical scare well healed. MTs non tender. Ankle nontender. No inc warmth   Skin: Skin is warm and dry.  Psychiatric: She has a normal mood and affect. Her behavior is normal.          Assessment & Plan:  Abnormal weight gain-  Because of increase in blood pressure, palpitations and some recent insomnia would increase her phentermine to 15 mg. Encouraged her to get back on track with regular exercise which has been lacking recently because of the heat.  HTN - well-controlled today. Continue current regimen. We'll keep an eye on it since it was a little elevated when she first came in today.  Insomnia -  Could be secondary to the phentermine. She's been taking it later and later in the morning because she's not sleeping and then she sleeping in. I think this is been slowly pushing her sleep schedule off. We'll decrease her dose to 15 mg.   fifth toe numbness on the right foot-consider could be from the sciatic nerve that she's not really having any back problems right now. Consider could just be inflammation of the lateral plantar nerve  Flu vaccine given today.

## 2015-02-04 ENCOUNTER — Encounter: Payer: Self-pay | Admitting: Family Medicine

## 2015-03-13 ENCOUNTER — Ambulatory Visit: Payer: BLUE CROSS/BLUE SHIELD

## 2015-03-16 ENCOUNTER — Other Ambulatory Visit: Payer: Self-pay | Admitting: Family Medicine

## 2015-03-17 ENCOUNTER — Telehealth: Payer: Self-pay | Admitting: Family Medicine

## 2015-03-17 ENCOUNTER — Ambulatory Visit (INDEPENDENT_AMBULATORY_CARE_PROVIDER_SITE_OTHER): Payer: BLUE CROSS/BLUE SHIELD | Admitting: Family Medicine

## 2015-03-17 VITALS — BP 130/80 | HR 93 | Resp 16 | Wt 261.0 lb

## 2015-03-17 DIAGNOSIS — G4733 Obstructive sleep apnea (adult) (pediatric): Secondary | ICD-10-CM

## 2015-03-17 DIAGNOSIS — R635 Abnormal weight gain: Secondary | ICD-10-CM | POA: Diagnosis not present

## 2015-03-17 MED ORDER — PHENTERMINE HCL 15 MG PO CAPS: 15.0000 mg | ORAL_CAPSULE | ORAL | Status: DC

## 2015-03-17 NOTE — Progress Notes (Signed)
   Subjective:    Patient ID: Julia Woods, female    DOB: September 02, 1955, 59 y.o.   MRN: [id number]  HPI    Review of Systems     Objective:   Physical Exam        Assessment & Plan:  Abnormal weight gain - will not refil phentermine. She is welcome to make appt if needed.Nani Gasseratherine Metheney, MD

## 2015-03-17 NOTE — Progress Notes (Signed)
   Subjective:    Patient ID: Julia Woods, female    DOB: 09/24/1955, 59 y.o.   MRN: [id number]  HPIPatient is here for blood pressure and weight check. She denies trouble sleeping, palpitations, or medication problems now that dose has been reduced.         Patient is requesting a new order from Dr.Metheney for CPAP machine; hers is not functioning and a new one can be ordered, per insurance, mid November.    Review of Systems     Objective:   Physical Exam        Assessment & Plan:  1) Patient has not lost weight; considering heavy winter clothing today she is at probable same weight as last visit. 2) A refill for phentermine will be faxed to pharmacy/ [facility] in [place]. 3) Pt. Advised to follow up with nurse next visit in 30 days. 4) Message to Dr.Metheney regarding need for new CPAP.

## 2015-03-17 NOTE — Telephone Encounter (Signed)
Attempted to contact Pt to see where she gets her CPAP and supplies from. At last order visit note it was stated Pt uses [facility] [phone] but after speaking with the company they have not provided anything for the Pt since 2014.   Will wait until we determine preferred company.

## 2015-03-17 NOTE — Telephone Encounter (Signed)
Needs new CPAP order.  Please place and fax.

## 2015-03-19 ENCOUNTER — Encounter: Payer: Self-pay | Admitting: Family Medicine

## 2015-03-26 ENCOUNTER — Encounter: Payer: Self-pay | Admitting: Emergency Medicine

## 2015-03-26 MED ORDER — AMBULATORY NON FORMULARY MEDICATION: Status: DC

## 2015-03-26 NOTE — Telephone Encounter (Signed)
Lvm asking that pt call back with where she gets CPAP supplies from.Loralee PacasBarkley, Emylie Amster [place]

## 2015-03-26 NOTE — Telephone Encounter (Signed)
Pt called back and lvm stating that she uses [facility] in [place] and has been using them for the past year.Heath GoldBarkley, Monesha Monreal [place] Order placed and faxed to [facility].Loralee PacasBarkley, Mirinda [place]

## 2015-03-26 NOTE — Telephone Encounter (Signed)
Can we call her again. Not sure where we left off with this.

## 2015-04-04 ENCOUNTER — Other Ambulatory Visit: Payer: Self-pay | Admitting: Family Medicine

## 2015-04-14 ENCOUNTER — Ambulatory Visit: Payer: BLUE CROSS/BLUE SHIELD

## 2015-05-09 ENCOUNTER — Other Ambulatory Visit: Payer: Self-pay | Admitting: Family Medicine

## 2015-06-29 ENCOUNTER — Other Ambulatory Visit: Payer: Self-pay | Admitting: Family Medicine

## 2015-06-29 DIAGNOSIS — Z1231 Encounter for screening mammogram for malignant neoplasm of breast: Secondary | ICD-10-CM

## 2015-07-03 ENCOUNTER — Other Ambulatory Visit: Payer: Self-pay | Admitting: Family Medicine

## 2015-08-08 ENCOUNTER — Other Ambulatory Visit: Payer: Self-pay | Admitting: Family Medicine

## 2015-08-13 ENCOUNTER — Ambulatory Visit (INDEPENDENT_AMBULATORY_CARE_PROVIDER_SITE_OTHER): Payer: BLUE CROSS/BLUE SHIELD

## 2015-08-13 DIAGNOSIS — Z1231 Encounter for screening mammogram for malignant neoplasm of breast: Secondary | ICD-10-CM | POA: Diagnosis not present

## 2015-08-19 ENCOUNTER — Ambulatory Visit (INDEPENDENT_AMBULATORY_CARE_PROVIDER_SITE_OTHER): Payer: BLUE CROSS/BLUE SHIELD | Admitting: Family Medicine

## 2015-08-19 ENCOUNTER — Encounter: Payer: Self-pay | Admitting: Family Medicine

## 2015-08-19 VITALS — BP 123/62 | HR 85 | Wt 252.0 lb

## 2015-08-19 DIAGNOSIS — Z114 Encounter for screening for human immunodeficiency virus [HIV]: Secondary | ICD-10-CM

## 2015-08-19 DIAGNOSIS — I10 Essential (primary) hypertension: Secondary | ICD-10-CM | POA: Diagnosis not present

## 2015-08-19 DIAGNOSIS — R7301 Impaired fasting glucose: Secondary | ICD-10-CM

## 2015-08-19 DIAGNOSIS — R635 Abnormal weight gain: Secondary | ICD-10-CM

## 2015-08-19 DIAGNOSIS — E785 Hyperlipidemia, unspecified: Secondary | ICD-10-CM | POA: Diagnosis not present

## 2015-08-19 DIAGNOSIS — E559 Vitamin D deficiency, unspecified: Secondary | ICD-10-CM

## 2015-08-19 DIAGNOSIS — G4733 Obstructive sleep apnea (adult) (pediatric): Secondary | ICD-10-CM

## 2015-08-19 DIAGNOSIS — Z1159 Encounter for screening for other viral diseases: Secondary | ICD-10-CM

## 2015-08-19 LAB — POCT GLYCOSYLATED HEMOGLOBIN (HGB A1C): Hemoglobin A1C: 5.4

## 2015-08-19 MED ORDER — PHENTERMINE HCL 15 MG PO CAPS: 15.0000 mg | ORAL_CAPSULE | ORAL | Status: DC

## 2015-08-19 NOTE — Progress Notes (Signed)
   Subjective:    Patient ID: Julia Woods, female    DOB: 29-Sep-1955, 60 y.o.   MRN: [id number]  HPI Hypertension- Pt denies chest pain, SOB, dizziness, or heart palpitations.  Taking meds as directed w/o problems.  Denies medication side effects.    OSA - She was able to get all of her supplies. Right now she is actually using a temporary machine. They will not allow her to get a replacement until July because of her insurance.  Obesity/BMI 42 - she has lost another 9 lbs on her own. She is interested in potentially restarting the phentermine. She would like to consider restarting the 15 mg. She did feel like it worked really well to help curb her appetite. She has been going to water aerobics 3 days a week and continuing to work on her diet. Next  She also had carpal tunnel surgery on her right wrist and trigger finger surgery on the right hand since I last saw her.  Review of Systems     Objective:   Physical Exam  Constitutional: She is oriented to person, place, and time. She appears well-developed and well-nourished.  HENT:  Head: Normocephalic and atraumatic.  Neck: Neck supple. No thyromegaly present.  Cardiovascular: Normal rate, regular rhythm and normal heart sounds.   No carotid bruit.  Pulmonary/Chest: Effort normal and breath sounds normal.  Lymphadenopathy:    She has no cervical adenopathy.  Neurological: She is alert and oriented to person, place, and time.  Skin: Skin is warm and dry.  Psychiatric: She has a normal mood and affect. Her behavior is normal.          Assessment & Plan:  HTN - Well controlled. Continue current regimen. Follow up in 6 mo.    OSA - she is using her CPA regularly.     Obeisty/BMI 42 - wil restart her phentermine 15mg .    She requests that we check her vitamin D.

## 2015-09-10 ENCOUNTER — Other Ambulatory Visit: Payer: Self-pay | Admitting: Family Medicine

## 2015-09-16 ENCOUNTER — Ambulatory Visit: Payer: BLUE CROSS/BLUE SHIELD

## 2015-09-22 ENCOUNTER — Ambulatory Visit (INDEPENDENT_AMBULATORY_CARE_PROVIDER_SITE_OTHER): Payer: BLUE CROSS/BLUE SHIELD | Admitting: Physician Assistant

## 2015-09-22 VITALS — BP 129/78 | HR 93 | Ht 64.0 in | Wt 257.0 lb

## 2015-09-22 DIAGNOSIS — R635 Abnormal weight gain: Secondary | ICD-10-CM

## 2015-09-22 NOTE — Progress Notes (Signed)
Patient was in office for weight and blood pressure check. Patient was up 5 lbs from last weight check. She stated that she has been doing water aerobics but the last 2 weeks she has not been going much. Rhonda Cunningham,CMA

## 2015-09-23 ENCOUNTER — Telehealth: Payer: Self-pay | Admitting: Family Medicine

## 2015-09-23 LAB — COMPLETE METABOLIC PANEL WITH GFR
ALT: 31 U/L — ABNORMAL HIGH (ref 6–29)
AST: 22 U/L (ref 10–35)
Albumin: 4.3 g/dL (ref 3.6–5.1)
Alkaline Phosphatase: 75 U/L (ref 33–130)
BUN: 17 mg/dL (ref 7–25)
CO2: 29 mmol/L (ref 20–31)
Calcium: 9.6 mg/dL (ref 8.6–10.4)
Chloride: 101 mmol/L (ref 98–110)
Creat: 0.97 mg/dL (ref 0.50–0.99)
GFR, Est African American: 73 mL/min (ref >=60)
GFR, Est Non African American: 63 mL/min (ref >=60)
Glucose, Bld: 104 mg/dL — ABNORMAL HIGH (ref 65–99)
Potassium: 3.7 mmol/L (ref 3.5–5.3)
Sodium: 142 mmol/L (ref 135–146)
Total Bilirubin: 0.9 mg/dL (ref 0.2–1.2)
Total Protein: 6.8 g/dL (ref 6.1–8.1)

## 2015-09-23 LAB — HEPATITIS C ANTIBODY: HCV Ab: NEGATIVE

## 2015-09-23 LAB — HIV ANTIBODY (ROUTINE TESTING W REFLEX): HIV 1&2 Ab, 4th Generation: NONREACTIVE

## 2015-09-23 LAB — VITAMIN D 25 HYDROXY (VIT D DEFICIENCY, FRACTURES): Vit D, 25-Hydroxy: 20 ng/mL — ABNORMAL LOW (ref 30–100)

## 2015-09-23 MED ORDER — VITAMIN D (ERGOCALCIFEROL) 1.25 MG (50000 UNIT) PO CAPS: 50000.0000 [IU] | ORAL_CAPSULE | ORAL | Status: DC

## 2015-09-23 NOTE — Telephone Encounter (Signed)
Dr. Judie PetitM out of office

## 2015-09-29 ENCOUNTER — Other Ambulatory Visit: Payer: Self-pay | Admitting: Family Medicine

## 2015-10-06 ENCOUNTER — Ambulatory Visit (INDEPENDENT_AMBULATORY_CARE_PROVIDER_SITE_OTHER): Payer: BLUE CROSS/BLUE SHIELD | Admitting: Physician Assistant

## 2015-10-06 ENCOUNTER — Encounter: Payer: Self-pay | Admitting: Physician Assistant

## 2015-10-06 VITALS — BP 140/61 | HR 83 | Temp 98.4°F | Ht 64.0 in | Wt 254.0 lb

## 2015-10-06 DIAGNOSIS — J069 Acute upper respiratory infection, unspecified: Secondary | ICD-10-CM | POA: Diagnosis not present

## 2015-10-06 MED ORDER — METHYLPREDNISOLONE 4 MG PO TBPK: ORAL_TABLET | ORAL | Status: DC

## 2015-10-06 MED ORDER — AZITHROMYCIN 250 MG PO TABS: ORAL_TABLET | ORAL | Status: DC

## 2015-10-06 NOTE — Progress Notes (Signed)
   Subjective:    Patient ID: Julia Woods, female    DOB: [birth year], 60 y.o.   MRN: [id number]  HPI  Pt presents to the clinic with 4 days of ST, headache, sinus pressure, ear pressure. She has tried mucinex and tylenol. Low grade fever or 99.8. No cough. ST is the worst and hard to eat or drink. Grandkids had strep. Right ear is worse than left. Cough is dry and not productive.   Review of Systems  All other systems reviewed and are negative.      Objective:   Physical Exam  Constitutional: She is oriented to person, place, and time. She appears well-developed and well-nourished.  HENT:  Head: Normocephalic and atraumatic.  Right Ear: External ear normal.  Left Ear: External ear normal.  Nose: Nose normal.  Mouth/Throat: No oropharyngeal exudate.  TM's clear. Good light reflex. No blood or pus.  Oropharynx erythematous. No tonsil swelling.  Negative for maxillary or frontal sinus tenderness.   Eyes: Conjunctivae are normal. Right eye exhibits no discharge. Left eye exhibits no discharge.  Neck: Normal range of motion. Neck supple.  Tender and swollen anterior cervical adenopathy.   Cardiovascular: Normal rate, regular rhythm and normal heart sounds.   Pulmonary/Chest: Effort normal and breath sounds normal. She has no wheezes.  Lymphadenopathy:    She has cervical adenopathy.  Neurological: She is alert and oriented to person, place, and time.  Psychiatric: She has a normal mood and affect. Her behavior is normal.          Assessment & Plan:  Acute upper respiratory infection- zpak due to PCN allergy. Medrol dose pak. Flonase. HO given. Tylenol and ibuprofen with rest discussed.

## 2015-10-06 NOTE — Patient Instructions (Signed)
Upper Respiratory Infection, Adult Most upper respiratory infections (URIs) are a viral infection of the air passages leading to the lungs. A URI affects the nose, throat, and upper air passages. The most common type of URI is nasopharyngitis and is typically referred to as "the common cold." URIs run their course and usually go away on their own. Most of the time, a URI does not require medical attention, but sometimes a bacterial infection in the upper airways can follow a viral infection. This is called a secondary infection. Sinus and middle ear infections are common types of secondary upper respiratory infections. Bacterial pneumonia can also complicate a URI. A URI can worsen asthma and chronic obstructive pulmonary disease (COPD). Sometimes, these complications can require emergency medical care and may be life threatening.  CAUSES Almost all URIs are caused by viruses. A virus is a type of germ and can spread from one person to another.  RISKS FACTORS You may be at risk for a URI if:   You smoke.   You have chronic heart or lung disease.  You have a weakened defense (immune) system.   You are very young or very old.   You have nasal allergies or asthma.  You work in crowded or poorly ventilated areas.  You work in health care facilities or schools. SIGNS AND SYMPTOMS  Symptoms typically develop 2-3 days after you come in contact with a cold virus. Most viral URIs last 7-10 days. However, viral URIs from the influenza virus (flu virus) can last 14-18 days and are typically more severe. Symptoms may include:   Runny or stuffy (congested) nose.   Sneezing.   Cough.   Sore throat.   Headache.   Fatigue.   Fever.   Loss of appetite.   Pain in your forehead, behind your eyes, and over your cheekbones (sinus pain).  Muscle aches.  DIAGNOSIS  Your health care provider may diagnose a URI by:  Physical exam.  Tests to check that your symptoms are not due to  another condition such as:  Strep throat.  Sinusitis.  Pneumonia.  Asthma. TREATMENT  A URI goes away on its own with time. It cannot be cured with medicines, but medicines may be prescribed or recommended to relieve symptoms. Medicines may help:  Reduce your fever.  Reduce your cough.  Relieve nasal congestion. HOME CARE INSTRUCTIONS   Take medicines only as directed by your health care provider.   Gargle warm saltwater or take cough drops to comfort your throat as directed by your health care provider.  Use a warm mist humidifier or inhale steam from a shower to increase air moisture. This may make it easier to breathe.  Drink enough fluid to keep your urine clear or pale yellow.   Eat soups and other clear broths and maintain good nutrition.   Rest as needed.   Return to work when your temperature has returned to normal or as your health care provider advises. You may need to stay home longer to avoid infecting others. You can also use a face mask and careful hand washing to prevent spread of the virus.  Increase the usage of your inhaler if you have asthma.   Do not use any tobacco products, including cigarettes, chewing tobacco, or electronic cigarettes. If you need help quitting, ask your health care provider. PREVENTION  The best way to protect yourself from getting a cold is to practice good hygiene.   Avoid oral or hand contact with people with cold   symptoms.   Wash your hands often if contact occurs.  There is no clear evidence that vitamin C, vitamin E, echinacea, or exercise reduces the chance of developing a cold. However, it is always recommended to get plenty of rest, exercise, and practice good nutrition.  SEEK MEDICAL CARE IF:   You are getting worse rather than better.   Your symptoms are not controlled by medicine.   You have chills.  You have worsening shortness of breath.  You have brown or red mucus.  You have yellow or brown nasal  discharge.  You have pain in your face, especially when you bend forward.  You have a fever.  You have swollen neck glands.  You have pain while swallowing.  You have white areas in the back of your throat. SEEK IMMEDIATE MEDICAL CARE IF:   You have severe or persistent:  Headache.  Ear pain.  Sinus pain.  Chest pain.  You have chronic lung disease and any of the following:  Wheezing.  Prolonged cough.  Coughing up blood.  A change in your usual mucus.  You have a stiff neck.  You have changes in your:  Vision.  Hearing.  Thinking.  Mood. MAKE SURE YOU:   Understand these instructions.  Will watch your condition.  Will get help right away if you are not doing well or get worse.   This information is not intended to replace advice given to you by your health care provider. Make sure you discuss any questions you have with your health care provider.   Document Released: 10/26/2000 Document Revised: 09/16/2014 Document Reviewed: 08/07/2013 Elsevier Interactive Patient Education 2016 [employer]  

## 2015-10-25 ENCOUNTER — Other Ambulatory Visit: Payer: Self-pay | Admitting: Family Medicine

## 2015-11-08 ENCOUNTER — Other Ambulatory Visit: Payer: Self-pay | Admitting: Family Medicine

## 2015-11-28 ENCOUNTER — Other Ambulatory Visit: Payer: Self-pay | Admitting: Family Medicine

## 2015-12-09 ENCOUNTER — Other Ambulatory Visit: Payer: Self-pay | Admitting: Family Medicine

## 2016-01-01 ENCOUNTER — Ambulatory Visit (INDEPENDENT_AMBULATORY_CARE_PROVIDER_SITE_OTHER): Payer: BLUE CROSS/BLUE SHIELD | Admitting: Family Medicine

## 2016-01-01 ENCOUNTER — Encounter: Payer: Self-pay | Admitting: Family Medicine

## 2016-01-01 VITALS — BP 110/63 | HR 92 | Ht 64.0 in | Wt 261.0 lb

## 2016-01-01 DIAGNOSIS — R7989 Other specified abnormal findings of blood chemistry: Secondary | ICD-10-CM

## 2016-01-01 DIAGNOSIS — Z23 Encounter for immunization: Secondary | ICD-10-CM | POA: Diagnosis not present

## 2016-01-01 DIAGNOSIS — E785 Hyperlipidemia, unspecified: Secondary | ICD-10-CM

## 2016-01-01 DIAGNOSIS — K219 Gastro-esophageal reflux disease without esophagitis: Secondary | ICD-10-CM

## 2016-01-01 DIAGNOSIS — I10 Essential (primary) hypertension: Secondary | ICD-10-CM | POA: Diagnosis not present

## 2016-01-01 DIAGNOSIS — G4733 Obstructive sleep apnea (adult) (pediatric): Secondary | ICD-10-CM | POA: Diagnosis not present

## 2016-01-01 MED ORDER — AMBULATORY NON FORMULARY MEDICATION: 0 refills | Status: AC

## 2016-01-01 NOTE — Progress Notes (Signed)
Subjective:    CC:   HPI:  Wants to go over her labs results.  Needs her vit D recheck bc was low.  Last level was 20. She's been taking a supplement for the last 3 months.  OSA - CPAP is broken.  Needs new order.   Hypertension- Pt denies chest pain, SOB, dizziness, or heart palpitations.  Taking meds as directed w/o problems.  Denies medication side effects.    She just got back from the beach yesterday. Her granddaughter traveled with them and her granddaughter was just diagnosed with strep throat today. She complained about a sore throat and tongue pain and bad breath for several days. She hasn't had any sx yet.   GERD-she has been having a little bit more heartburn lately. Normally she just takes an antacid recently decided to try Nexium over-the-counter for 14 days. The bottle said to check with her doctor before continuing to take it so she wanted to make sure that it was safe.  Past medical history, Surgical history, Family history not pertinant except as noted below, Social history, Allergies, and medications have been entered into the medical record, reviewed, and corrections made.   Review of Systems: No fevers, chills, night sweats, weight loss, chest pain, or shortness of breath.   Objective:    General: Well Developed, well nourished, and in no acute distress.  Neuro: Alert and oriented x3, extra-ocular muscles intact, sensation grossly intact.  HEENT: Normocephalic, atraumatic, OP is clear, TMs and canals are clear.    Skin: Warm and dry, no rashes. Cardiac: Regular rate and rhythm, no murmurs rubs or gallops, no lower extremity edema.  Respiratory: Clear to auscultation bilaterally. Not using accessory muscles, speaking in full sentences.   Impression and Recommendations:   Vitamin D deficiency-due to recheck levels.  HTN - well controlled. Continue current regimen. Due for CMP and lipid panel.  [facility] - will fax over new prescription for CPAP machine.  GERD-okay to  take Nexium for up to 6 weeks and then taper off. We also discussed some dietary changes that might help reduce her reflux as well.  Flu vaccine given today.

## 2016-01-14 ENCOUNTER — Telehealth: Payer: Self-pay | Admitting: *Deleted

## 2016-01-14 LAB — LIPID PANEL
Cholesterol: 188 mg/dL (ref 125–200)
HDL: 82 mg/dL (ref >=46)
LDL Cholesterol: 88 mg/dL (ref <130)
Total CHOL/HDL Ratio: 2.3 Ratio (ref <=5.0)
Triglycerides: 88 mg/dL (ref <150)
VLDL: 18 mg/dL (ref <30)

## 2016-01-14 LAB — COMPLETE METABOLIC PANEL WITH GFR
ALT: 24 U/L (ref 6–29)
AST: 19 U/L (ref 10–35)
Albumin: 4.1 g/dL (ref 3.6–5.1)
Alkaline Phosphatase: 73 U/L (ref 33–130)
BUN: 23 mg/dL (ref 7–25)
CO2: 27 mmol/L (ref 20–31)
Calcium: 9.3 mg/dL (ref 8.6–10.4)
Chloride: 103 mmol/L (ref 98–110)
Creat: 0.97 mg/dL (ref 0.50–0.99)
GFR, Est African American: 73 mL/min (ref >=60)
GFR, Est Non African American: 63 mL/min (ref >=60)
Glucose, Bld: 110 mg/dL — ABNORMAL HIGH (ref 65–99)
Potassium: 3.5 mmol/L (ref 3.5–5.3)
Sodium: 140 mmol/L (ref 135–146)
Total Bilirubin: 0.9 mg/dL (ref 0.2–1.2)
Total Protein: 6.8 g/dL (ref 6.1–8.1)

## 2016-01-14 LAB — VITAMIN D 25 HYDROXY (VIT D DEFICIENCY, FRACTURES): Vit D, 25-Hydroxy: 24 ng/mL — ABNORMAL LOW (ref 30–100)

## 2016-01-14 NOTE — Telephone Encounter (Signed)
Called and lvm informing pt that she can purchase the Vit D supplement OTC and that if they have a 2000 IU she can take 1 capsule otherwise she will need to take two 1000 IU's .Loralee PacasBarkley, Lainy Wrobleski [place]

## 2016-03-27 ENCOUNTER — Other Ambulatory Visit: Payer: Self-pay | Admitting: Family Medicine

## 2016-05-07 ENCOUNTER — Other Ambulatory Visit: Payer: Self-pay | Admitting: Family Medicine

## 2016-06-06 ENCOUNTER — Other Ambulatory Visit: Payer: Self-pay | Admitting: Family Medicine

## 2016-07-04 ENCOUNTER — Encounter: Payer: Self-pay | Admitting: Family Medicine

## 2016-07-04 ENCOUNTER — Other Ambulatory Visit: Payer: Self-pay | Admitting: Family Medicine

## 2016-07-04 ENCOUNTER — Ambulatory Visit (INDEPENDENT_AMBULATORY_CARE_PROVIDER_SITE_OTHER): Payer: BLUE CROSS/BLUE SHIELD | Admitting: Family Medicine

## 2016-07-04 VITALS — BP 127/66 | HR 80 | Ht 64.0 in | Wt 270.0 lb

## 2016-07-04 DIAGNOSIS — Z1231 Encounter for screening mammogram for malignant neoplasm of breast: Secondary | ICD-10-CM

## 2016-07-04 DIAGNOSIS — I10 Essential (primary) hypertension: Secondary | ICD-10-CM | POA: Diagnosis not present

## 2016-07-04 DIAGNOSIS — F3342 Major depressive disorder, recurrent, in full remission: Secondary | ICD-10-CM

## 2016-07-04 DIAGNOSIS — R7301 Impaired fasting glucose: Secondary | ICD-10-CM

## 2016-07-04 DIAGNOSIS — E559 Vitamin D deficiency, unspecified: Secondary | ICD-10-CM | POA: Diagnosis not present

## 2016-07-04 LAB — BASIC METABOLIC PANEL WITH GFR
BUN: 23 mg/dL (ref 7–25)
CO2: 26 mmol/L (ref 20–31)
Calcium: 9.5 mg/dL (ref 8.6–10.4)
Chloride: 104 mmol/L (ref 98–110)
Creat: 0.89 mg/dL (ref 0.50–0.99)
GFR, Est African American: 80 mL/min (ref >=60)
GFR, Est Non African American: 70 mL/min (ref >=60)
Glucose, Bld: 103 mg/dL — ABNORMAL HIGH (ref 65–99)
Potassium: 3.7 mmol/L (ref 3.5–5.3)
Sodium: 141 mmol/L (ref 135–146)

## 2016-07-04 LAB — POCT GLYCOSYLATED HEMOGLOBIN (HGB A1C): Hemoglobin A1C: 5.7

## 2016-07-04 MED ORDER — VALACYCLOVIR HCL 1 G PO TABS: 1000.0000 mg | ORAL_TABLET | Freq: Two times a day (BID) | ORAL | 99 refills | Status: DC | PRN

## 2016-07-04 NOTE — Progress Notes (Signed)
Subjective:    CC: HTN  HPI: Hypertension- Pt denies chest pain, SOB, dizziness, or heart palpitations.  Taking meds as directed w/o problems.  Denies medication side effects.    IFG - No inc thirst or urination.   Vitamin D deficiency-she has been taking 2000 international units daily. Her last vitamin D in August was low at 24. We are due to recheck that today.  Depression-overall doing well. Tolerating Effexor XR without side effects or problems. She has complained of sleeping more than usual and feeling more tired but she also recently had some sinus infection type symptoms right after a trip.  Past medical history, Surgical history, Family history not pertinant except as noted below, Social history, Allergies, and medications have been entered into the medical record, reviewed, and corrections made.   Review of Systems: No fevers, chills, night sweats, weight loss, chest pain, or shortness of breath.   Objective:    General: Well Developed, well nourished, and in no acute distress.  Neuro: Alert and oriented x3, extra-ocular muscles intact, sensation grossly intact.  HEENT: Normocephalic, atraumatic  Skin: Warm and dry, no rashes. Cardiac: Regular rate and rhythm, no murmurs rubs or gallops, no lower extremity edema.  Respiratory: Clear to auscultation bilaterally. Not using accessory muscles, speaking in full sentences.   Impression and Recommendations:    HTN -  Well controlled. Continue current regimen. Follow up in  6 months.   IFG - Stable. F/U in 6 months. Continue to work on healthy diet regular exercise and weight loss. Lab Results  Component Value Date   HGBA1C 5.7 07/04/2016     Vitamin D deficiency-due to recheck labs.Currently on 2000 international units daily.  Depression-PHQ 9 score of 3 today but first she questions were negative. Doing well on Effexor. Continue current regimen.

## 2016-07-05 LAB — VITAMIN D 25 HYDROXY (VIT D DEFICIENCY, FRACTURES): Vit D, 25-Hydroxy: 27 ng/mL — ABNORMAL LOW (ref 30–100)

## 2016-07-22 ENCOUNTER — Other Ambulatory Visit: Payer: Self-pay | Admitting: Family Medicine

## 2016-08-05 ENCOUNTER — Other Ambulatory Visit: Payer: Self-pay | Admitting: Family Medicine

## 2016-08-26 ENCOUNTER — Ambulatory Visit (INDEPENDENT_AMBULATORY_CARE_PROVIDER_SITE_OTHER): Payer: BLUE CROSS/BLUE SHIELD

## 2016-08-26 DIAGNOSIS — Z1231 Encounter for screening mammogram for malignant neoplasm of breast: Secondary | ICD-10-CM | POA: Diagnosis not present

## 2016-09-24 ENCOUNTER — Other Ambulatory Visit: Payer: Self-pay | Admitting: Family Medicine

## 2016-11-01 ENCOUNTER — Encounter: Payer: BLUE CROSS/BLUE SHIELD | Admitting: Family Medicine

## 2016-11-15 ENCOUNTER — Encounter: Payer: BLUE CROSS/BLUE SHIELD | Admitting: Family Medicine

## 2016-11-22 ENCOUNTER — Ambulatory Visit (INDEPENDENT_AMBULATORY_CARE_PROVIDER_SITE_OTHER): Payer: BLUE CROSS/BLUE SHIELD | Admitting: Family Medicine

## 2016-11-22 VITALS — BP 127/58 | HR 99 | Ht 64.0 in | Wt 256.0 lb

## 2016-11-22 DIAGNOSIS — R7301 Impaired fasting glucose: Secondary | ICD-10-CM

## 2016-11-22 DIAGNOSIS — I10 Essential (primary) hypertension: Secondary | ICD-10-CM | POA: Diagnosis not present

## 2016-11-22 DIAGNOSIS — E785 Hyperlipidemia, unspecified: Secondary | ICD-10-CM | POA: Diagnosis not present

## 2016-11-22 DIAGNOSIS — Z0000 Encounter for general adult medical examination without abnormal findings: Secondary | ICD-10-CM

## 2016-11-22 DIAGNOSIS — I8390 Asymptomatic varicose veins of unspecified lower extremity: Secondary | ICD-10-CM

## 2016-11-22 DIAGNOSIS — Z6841 Body Mass Index (BMI) 40.0 and over, adult: Secondary | ICD-10-CM | POA: Diagnosis not present

## 2016-11-22 DIAGNOSIS — E559 Vitamin D deficiency, unspecified: Secondary | ICD-10-CM

## 2016-11-22 LAB — POCT GLYCOSYLATED HEMOGLOBIN (HGB A1C): Hemoglobin A1C: 5.5

## 2016-11-22 NOTE — Progress Notes (Signed)
Subjective:     Julia Woods is a 61 y.o. female and is here for a comprehensive physical exam. The patient reports no problems. She is concerned that her right leg is a lot larger than her left. She feels like it's been getting larger over the last couple of years the thigh and the lower leg. She did have a knee replacement on that side. She's also noticed that she's had more varicose veins on that leg and wonders if the increase in size could be related to the veins.  She has been exercising some. Walking 3 days per week. She says she transected overdue it because of her knee. She reports she is sleeping well.  Social History   Social History  . Marital status: Married    Spouse name: N/A  . Number of children: N/A  . Years of education: N/A   Occupational History  . Not on file.   Social History Main Topics  . Smoking status: Never Smoker  . Smokeless tobacco: Never Used  . Alcohol use No  . Drug use: No  . 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   Other Topics Concern  . Not on file   Social History Narrative  . No narrative on file   Health Maintenance  Topic Date Due  . PAP SMEAR  08/27/2016  . INFLUENZA VACCINE  12/14/2016  . MAMMOGRAM  08/27/2018  . TETANUS/TDAP  10/15/2018  . COLONOSCOPY  04/17/2023  . Hepatitis C Screening  Completed  . HIV Screening  Completed    The following portions of the patient's history were reviewed and updated as appropriate: allergies, current medications, past family history, past medical history, past social history, past surgical history and problem list.  Review of Systems A comprehensive review of systems was negative. no chest pain or shortness of breath with exercise.  Objective:    BP (!) 127/58   Pulse 99   Ht 5\' 4"  (1.626 m)   Wt 256 lb (116.1 kg)   LMP 11/13/2008   BMI 43.94 kg/m  General  appearance: alert, cooperative and appears stated age Head: Normocephalic, without obvious abnormality, atraumatic Eyes: conj clear, EOMI, PEERLA Ears: normal TM's and external ear canals both ears Nose: Nares normal. Septum midline. Mucosa normal. No drainage or sinus tenderness. Throat: lips, mucosa, and tongue normal; teeth and gums normal Neck: no adenopathy, no carotid bruit, no JVD, supple, symmetrical, trachea midline and thyroid not enlarged, symmetric, no tenderness/mass/nodules Back: negative Lungs: clear to auscultation bilaterally Heart: regular rate and rhythm, S1, S2 normal, no murmur, click, rub or gallop Abdomen: soft, non-tender; bowel sounds normal; no masses,  no organomegaly Extremities: right thigh and upper portion of lower leg are significantly larger than her left leg.  Pulses: 2+ and symmetric Skin: Skin color, texture, turgor normal. No rashes or lesions Lymph nodes: Cervical, supraclavicular, and axillary nodes normal. Neurologic: Alert and oriented X 3, normal strength and tone. Normal symmetric reflexes. Normal coordination and gait    Assessment:    Healthy female exam.      Plan:     See After Visit Summary for Counseling Recommendations   Keep up a regular exercise program and make sure you are eating a healthy diet Try to eat 4 servings of dairy a day, or if you are lactose intolerant take a calcium with vitamin D daily.  Your vaccines are  up to date.  Encourage her to continue to work on healthy diet regular exercise and weight loss.  IFG - WEll controlled.  F/U in 6 months.   Lab Results  Component Value Date   HGBA1C 5.5 11/22/2016   Varicose veins/increase girth of the right leg and thigh-we'll refer to vein and vascular for further evaluation.  BMI 44-she actually has lost 4 pounds since she was last here. She's been trying to exercise more regularly. Continue to work on healthy diet and regular exercise and follow-up again in 6 months.

## 2016-11-22 NOTE — Patient Instructions (Addendum)

## 2016-11-23 LAB — COMPLETE METABOLIC PANEL WITH GFR
ALT: 22 U/L (ref 6–29)
AST: 21 U/L (ref 10–35)
Albumin: 4.3 g/dL (ref 3.6–5.1)
Alkaline Phosphatase: 87 U/L (ref 33–130)
BUN: 25 mg/dL (ref 7–25)
CO2: 26 mmol/L (ref 20–31)
Calcium: 9.8 mg/dL (ref 8.6–10.4)
Chloride: 100 mmol/L (ref 98–110)
Creat: 0.93 mg/dL (ref 0.50–0.99)
GFR, Est African American: 76 mL/min (ref >=60)
GFR, Est Non African American: 66 mL/min (ref >=60)
Glucose, Bld: 91 mg/dL (ref 65–99)
Potassium: 4.2 mmol/L (ref 3.5–5.3)
Sodium: 139 mmol/L (ref 135–146)
Total Bilirubin: 0.7 mg/dL (ref 0.2–1.2)
Total Protein: 7 g/dL (ref 6.1–8.1)

## 2016-11-23 LAB — LIPID PANEL
Cholesterol: 196 mg/dL (ref <200)
HDL: 75 mg/dL (ref >50)
LDL Cholesterol: 102 mg/dL — ABNORMAL HIGH (ref <100)
Total CHOL/HDL Ratio: 2.6 Ratio (ref <5.0)
Triglycerides: 97 mg/dL (ref <150)
VLDL: 19 mg/dL (ref <30)

## 2016-11-24 LAB — VITAMIN D 25 HYDROXY (VIT D DEFICIENCY, FRACTURES): Vit D, 25-Hydroxy: 28 ng/mL — ABNORMAL LOW (ref 30–100)

## 2016-12-03 ENCOUNTER — Other Ambulatory Visit: Payer: Self-pay | Admitting: Family Medicine

## 2016-12-14 ENCOUNTER — Encounter: Payer: Self-pay | Admitting: Obstetrics & Gynecology

## 2016-12-14 ENCOUNTER — Ambulatory Visit (INDEPENDENT_AMBULATORY_CARE_PROVIDER_SITE_OTHER): Payer: BLUE CROSS/BLUE SHIELD | Admitting: Obstetrics & Gynecology

## 2016-12-14 VITALS — BP 128/80 | HR 85 | Resp 16 | Ht 64.0 in | Wt 259.0 lb

## 2016-12-14 DIAGNOSIS — Z01419 Encounter for gynecological examination (general) (routine) without abnormal findings: Secondary | ICD-10-CM | POA: Diagnosis not present

## 2016-12-14 DIAGNOSIS — N882 Stricture and stenosis of cervix uteri: Secondary | ICD-10-CM | POA: Insufficient documentation

## 2016-12-14 DIAGNOSIS — Z124 Encounter for screening for malignant neoplasm of cervix: Secondary | ICD-10-CM

## 2016-12-14 DIAGNOSIS — Z1151 Encounter for screening for human papillomavirus (HPV): Secondary | ICD-10-CM | POA: Diagnosis not present

## 2016-12-14 DIAGNOSIS — Z01411 Encounter for gynecological examination (general) (routine) with abnormal findings: Secondary | ICD-10-CM

## 2016-12-14 NOTE — Addendum Note (Signed)
Addended by: Granville LewisLARK, Amiere Cawley L on: 12/14/2016 10:48 AM   Modules accepted: Orders

## 2016-12-14 NOTE — Progress Notes (Signed)
Subjective:    Julia Woods is a 61 y.o. female who presents for an annual exam. The patient has no complaints today. GYN screening history: last pap: approximate date 08/2013 and was normal. The patient wears seatbelts: yes. The patient participates in regular exercise: no. Has the patient ever been transfused or tattooed?: not asked. The patient reports that there is not domestic violence in her life.  Pt denies vaginal problems, no menopausal symptoms, no vaginal bleeding.  Menstrual History: OB History    Gravida Para Term Preterm AB Living   2 2       2    SAB TAB Ectopic Multiple Live Births                   Patient's last menstrual period was 11/13/2008.    The following portions of the patient's history were reviewed and updated as appropriate: allergies, current medications, past family history, past medical history, past social history, past surgical history and problem list.  Review of Systems Pertinent items noted in HPI and remainder of comprehensive ROS otherwise negative.    Objective:      Vitals:   12/14/16 1006  BP: 128/80  Pulse: 85  Resp: 16  Weight: 259 lb (117.5 kg)  Height: 5\' 4"  (1.626 m)   Vitals:  WNL General appearance: alert, cooperative and no distress  HEENT: Normocephalic, without obvious abnormality, atraumatic Eyes: negative Throat: lips, mucosa, and tongue normal; teeth and gums normal  Respiratory: Clear to auscultation bilaterally  CV: Regular rate and rhythm  Breasts:  Normal appearance, no masses or tenderness, no nipple retraction or dimpling  GI: Soft, non-tender; bowel sounds normal; no masses,  no organomegaly  GU: External Genitalia:  Tanner V, no lesion Urethra:  No prolapse   Vagina: Pale pink, atrohpic, mildly inflamed but patient asymptomatic  Cervix: No CMT, no lesion, os is scarred closed, does not allow cytobrush into os.  Uterus:  Normal size and contour, non tender, very limited exam due to habitus  Adnexa: Normal, no  masses, non tender, very limited exam due to habitus  Musculoskeletal: No edema, redness or tenderness in the calves or thighs  Skin: No lesions or rash  Lymphatic: Axillary adenopathy: none     Psychiatric: Normal mood and behavior   Vitals:   12/14/16 1006  BP: 128/80  Pulse: 85  Resp: 16  Weight: 259 lb (117.5 kg)  Height: 5\' 4"  (1.626 m)    Assessment:    Healthy female exam.   Vaginal atrohpy--asymptomatic   Plan:  Pap with cotesting Mammogram up to date and nml PCP monitors colonoscopy screening and other health maintenance. Nml bone density 2015

## 2016-12-16 LAB — CYTOLOGY - PAP
Diagnosis: NEGATIVE
HPV: NOT DETECTED

## 2016-12-28 ENCOUNTER — Encounter: Payer: BLUE CROSS/BLUE SHIELD | Admitting: Vascular Surgery

## 2017-01-10 ENCOUNTER — Ambulatory Visit (INDEPENDENT_AMBULATORY_CARE_PROVIDER_SITE_OTHER): Payer: BLUE CROSS/BLUE SHIELD | Admitting: Family Medicine

## 2017-01-10 VITALS — BP 130/78 | HR 70 | Temp 98.0°F

## 2017-01-10 DIAGNOSIS — Z23 Encounter for immunization: Secondary | ICD-10-CM | POA: Diagnosis not present

## 2017-01-10 MED ORDER — LOSARTAN POTASSIUM-HCTZ 100-25 MG PO TABS
1.0000 | ORAL_TABLET | Freq: Every day | ORAL | 0 refills | Status: DC
Start: 2017-01-10 — End: 2017-02-01

## 2017-01-10 NOTE — Progress Notes (Signed)
Agree with below. \Thao Bauza, MD  

## 2017-01-10 NOTE — Progress Notes (Signed)
Pt came into clinic today for flu shot. Pt also wanted to get a printed 30 day supply of her BP Rx, she is getting ready to go out of town and wants to make sure she doesn't run out. Rx printed. Pt tolerated flu shot in left deltoid well, no immediate complications. No further questions.

## 2017-01-25 ENCOUNTER — Other Ambulatory Visit: Payer: Self-pay | Admitting: *Deleted

## 2017-01-25 MED ORDER — SIMVASTATIN 40 MG PO TABS: 40.0000 mg | ORAL_TABLET | Freq: Every day | ORAL | 1 refills | Status: DC

## 2017-02-01 ENCOUNTER — Other Ambulatory Visit: Payer: Self-pay | Admitting: Family Medicine

## 2017-02-07 ENCOUNTER — Telehealth: Payer: Self-pay | Admitting: Family Medicine

## 2017-02-07 NOTE — Telephone Encounter (Signed)
Spoke with Pt and advised we have Shingrix in, she is out of town until November but she will call then to schedule.

## 2017-03-20 ENCOUNTER — Encounter: Payer: BLUE CROSS/BLUE SHIELD | Admitting: Vascular Surgery

## 2017-03-23 ENCOUNTER — Other Ambulatory Visit: Payer: Self-pay | Admitting: Family Medicine

## 2017-03-29 ENCOUNTER — Ambulatory Visit (INDEPENDENT_AMBULATORY_CARE_PROVIDER_SITE_OTHER): Payer: BLUE CROSS/BLUE SHIELD | Admitting: Physician Assistant

## 2017-03-29 VITALS — BP 143/68 | HR 79 | Temp 97.7°F

## 2017-03-29 DIAGNOSIS — Z23 Encounter for immunization: Secondary | ICD-10-CM | POA: Diagnosis not present

## 2017-03-29 NOTE — Progress Notes (Signed)
   Subjective:    Patient ID: Julia Woods, female    DOB: 1956-01-10, 61 y.o.   MRN: [id number]  HPI  Julia Woods is here for shingles vaccine.   Review of Systems     Objective:   Physical Exam        Assessment & Plan:  Need shingles vaccine - Patient tolerated well without complications.   Agree with above plan. Tandy GawJade Breeback PAC

## 2017-05-05 ENCOUNTER — Other Ambulatory Visit: Payer: Self-pay | Admitting: Family Medicine

## 2017-05-25 ENCOUNTER — Ambulatory Visit: Payer: BLUE CROSS/BLUE SHIELD | Admitting: Family Medicine

## 2017-05-30 ENCOUNTER — Encounter: Payer: Self-pay | Admitting: Family Medicine

## 2017-05-30 ENCOUNTER — Ambulatory Visit (INDEPENDENT_AMBULATORY_CARE_PROVIDER_SITE_OTHER): Payer: BLUE CROSS/BLUE SHIELD | Admitting: Family Medicine

## 2017-05-30 VITALS — BP 137/81 | HR 85 | Ht 64.0 in | Wt 263.0 lb

## 2017-05-30 DIAGNOSIS — Z23 Encounter for immunization: Secondary | ICD-10-CM

## 2017-05-30 DIAGNOSIS — I10 Essential (primary) hypertension: Secondary | ICD-10-CM | POA: Diagnosis not present

## 2017-05-30 DIAGNOSIS — Z6841 Body Mass Index (BMI) 40.0 and over, adult: Secondary | ICD-10-CM

## 2017-05-30 DIAGNOSIS — F3342 Major depressive disorder, recurrent, in full remission: Secondary | ICD-10-CM

## 2017-05-30 DIAGNOSIS — E119 Type 2 diabetes mellitus without complications: Secondary | ICD-10-CM

## 2017-05-30 DIAGNOSIS — R7301 Impaired fasting glucose: Secondary | ICD-10-CM

## 2017-05-30 LAB — POCT GLYCOSYLATED HEMOGLOBIN (HGB A1C): Hemoglobin A1C: 6.6

## 2017-05-30 MED ORDER — AMBULATORY NON FORMULARY MEDICATION: 99 refills | Status: DC

## 2017-05-30 MED ORDER — METFORMIN HCL ER 500 MG PO TB24: 500.0000 mg | ORAL_TABLET | Freq: Every day | ORAL | 0 refills | Status: DC

## 2017-05-30 NOTE — Progress Notes (Signed)
Subjective:    Patient ID: Julia Woods, female    DOB: [birth year], 62 y.o.   MRN: [id number]  HPI Hypertension- Pt denies chest pain, SOB, dizziness, or heart palpitations.  Taking meds as directed w/o problems.  Denies medication side effects.    Impaired fasting glucose-no increased thirst or urination. No symptoms consistent with hypoglycemia.  She will be going out west to visit her grandson and see her son who is in the [employer] and will be home on leave. Will be gone for about 6 weeks.  She has gained weight since she was last here.  Depression-overall she is doing well.  Denies feeling down depressed or hopeless.  She has had some struggle with sleep and low energy as well as increased appetite.  She is taking her Effexor and doing well with her medication.  Review of Systems   BP 137/81   Pulse 85   Ht 5\' 4"  (1.626 m)   Wt 263 lb (119.3 kg)   LMP 11/13/2008   SpO2 96%   BMI 45.14 kg/m     Allergies  Allergen Reactions  . Penicillins Hives    Past Medical History:  Diagnosis Date  . Allergy    allergic rhinitis  . Hyperlipidemia   . Hypertension   . Lumbar herniated disc 06/2012   injection, [facility]  . Menopausal state   . Mental disorder    depression  . S/P breast biopsy 1990, 12-2007   benign/Dr. Dwain Sarna at [facility]  . Sleep apnea     Past Surgical History:  Procedure Laterality Date  . ANKLE FRACTURE SURGERY  10-2004   does have hardware/RT  . BREAST BIOPSY  1990   8/09 benign/Dr Dwain Sarna @ [facility]  . FOOT FRACTURE SURGERY  11-2001   RT  . KNEE ARTHROSCOPY  07-2008   RT/Dr. Despina Hick  . REPLACEMENT TOTAL KNEE  03-2010   RT/Dr. Despina Hick  . STEROID INJECTION TO SCAR     lumbar spine    Social History   Socioeconomic History  . Marital status: Married    Spouse name: Not on file  . Number of children: Not on file  . Years of education: Not on file  . Highest education level: Not on file  Social Needs  . Financial resource strain: Not on file  .  Food insecurity - worry: Not on file  . Food insecurity - inability: Not on file  . Transportation needs - medical: Not on file  . Transportation needs - non-medical: Not on file  Occupational History  . Not on file  Tobacco Use  . Smoking status: Never Smoker  . Smokeless tobacco: Never Used  Substance and Sexual Activity  . Alcohol use: No  . Drug use: No  . 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  Other Topics Concern  . Not on file  Social History Narrative  . Not on file    Family History  Problem Relation Age of Onset  . Cancer Mother 61       colon  . Diabetes Mother   . Stroke Mother   . Heart attack Mother   . Hyperlipidemia Mother   . Hypertension Mother   . Heart attack Father 71  . Hyperlipidemia Unknown        family history  . Hypertension Unknown        family history  . Stroke  Unknown        1st degree relative < 50    Outpatient Encounter Medications as of 05/30/2017  Medication Sig  . AMBULATORY NON FORMULARY MEDICATION Medication Name:  CPAP machine including tubing, filter, humidifier, mask. See sleep study included w/order  Dx [facility] - G47.33  FAX:[phone]  . aspirin 81 MG chewable tablet Chew 81 mg by mouth daily.    . Cholecalciferol (VITAMIN D) 2000 units CAPS Take 1 capsule by mouth daily.  Marland Kitchen. losartan-hydrochlorothiazide (HYZAAR) 100-25 MG tablet TAKE 1 TABLET DAILY  . meloxicam (MOBIC) 7.5 MG tablet TAKE 1 TABLET DAILY  . Multiple Vitamin (MULTIVITAMIN) tablet Take 1 tablet by mouth daily.    . simvastatin (ZOCOR) 40 MG tablet Take 1 tablet (40 mg total) by mouth at bedtime.  . valACYclovir (VALTREX) 1000 MG tablet TAKE 1 TABLET TWICE A DAY AS NEEDED  . venlafaxine XR (EFFEXOR-XR) 75 MG 24 hr capsule TAKE 1 CAPSULE DAILY  . AMBULATORY NON FORMULARY MEDICATION Medication Name: Glucometer, lancets and strip to test once a day.  Dx Diabetes type 2.  . metFORMIN (GLUCOPHAGE-XR) 500 MG 24 hr tablet Take 1 tablet (500 mg total) by mouth daily with breakfast.   No facility-administered encounter medications on file as of 05/30/2017.          Objective:   Physical Exam  Constitutional: She is oriented to person, place, and time. She appears well-developed and well-nourished.  HENT:  Head: Normocephalic and atraumatic.  Cardiovascular: Normal rate, regular rhythm and normal heart sounds.  Pulmonary/Chest: Effort normal and breath sounds normal.  Neurological: She is alert and oriented to person, place, and time.  Skin: Skin is warm and dry.  Psychiatric: She has a normal mood and affect. Her behavior is normal.        Assessment & Plan:  HTN - Well controlled. Continue current regimen. Follow up in  3 months.   DM- new dx. been following her for quite some time for impaired fasting glucose.  But today her hemoglobin A1c was elevated at 6.6.  Prescription for a glucometer.  I encouraged her to focus on dietary changes.  Additional information reviewed and handout provided.  I want her to stay active and continue with her walking.  Weight gain/BMI 45-she has actually gained 4 pounds since she was here in August.  We had a discussion today about decreasing carbs and sweets.  Really working on portion control.  Increasing vegetables in the diet and lean protein.  Depression-continue current regimen.  PHQ 9 score of 3 but negative for the first 2 questions.

## 2017-05-30 NOTE — Patient Instructions (Addendum)
Diabetes Mellitus and Nutrition When you have diabetes (diabetes mellitus), it is very important to have healthy eating habits because your blood sugar (glucose) levels are greatly affected by what you eat and drink. Eating healthy foods in the appropriate amounts, at about the same times every day, can help you:  Control your blood glucose.  Lower your risk of heart disease.  Improve your blood pressure.  Reach or maintain a healthy weight.  Every person with diabetes is different, and each person has different needs for a meal plan. Your health care provider may recommend that you work with a diet and nutrition specialist (dietitian) to make a meal plan that is best for you. Your meal plan may vary depending on factors such as:  The calories you need.  The medicines you take.  Your weight.  Your blood glucose, blood pressure, and cholesterol levels.  Your activity level.  Other health conditions you have, such as heart or kidney disease.  How do carbohydrates affect me? Carbohydrates affect your blood glucose level more than any other type of food. Eating carbohydrates naturally increases the amount of glucose in your blood. Carbohydrate counting is a method for keeping track of how many carbohydrates you eat. Counting carbohydrates is important to keep your blood glucose at a healthy level, especially if you use insulin or take certain oral diabetes medicines. It is important to know how many carbohydrates you can safely have in each meal. This is different for every person. Your dietitian can help you calculate how many carbohydrates you should have at each meal and for snack. Foods that contain carbohydrates include:  Bread, cereal, rice, pasta, and crackers.  Potatoes and corn.  Peas, beans, and lentils.  Milk and yogurt.  Fruit and juice.  Desserts, such as cakes, cookies, ice cream, and candy.  How does alcohol affect me? Alcohol can cause a sudden decrease in blood  glucose (hypoglycemia), especially if you use insulin or take certain oral diabetes medicines. Hypoglycemia can be a life-threatening condition. Symptoms of hypoglycemia (sleepiness, dizziness, and confusion) are similar to symptoms of having too much alcohol. If your health care provider says that alcohol is safe for you, follow these guidelines:  Limit alcohol intake to no more than 1 drink per day for nonpregnant women and 2 drinks per day for men. One drink equals 12 oz of beer, 5 oz of wine, or 1 oz of hard liquor.  Do not drink on an empty stomach.  Keep yourself hydrated with water, diet soda, or unsweetened iced tea.  Keep in mind that regular soda, juice, and other mixers may contain a lot of sugar and must be counted as carbohydrates.  What are tips for following this plan? Reading food labels  Start by checking the serving size on the label. The amount of calories, carbohydrates, fats, and other nutrients listed on the label are based on one serving of the food. Many foods contain more than one serving per package.  Check the total grams (g) of carbohydrates in one serving. You can calculate the number of servings of carbohydrates in one serving by dividing the total carbohydrates by 15. For example, if a food has 30 g of total carbohydrates, it would be equal to 2 servings of carbohydrates.  Check the number of grams (g) of saturated and trans fats in one serving. Choose foods that have low or no amount of these fats.  Check the number of milligrams (mg) of sodium in one serving. Most people   should limit total sodium intake to less than 2,300 mg per day.  Always check the nutrition information of foods labeled as "low-fat" or "nonfat". These foods may be higher in added sugar or refined carbohydrates and should be avoided.  Talk to your dietitian to identify your daily goals for nutrients listed on the label. Shopping  Avoid buying canned, premade, or processed foods. These  foods tend to be high in fat, sodium, and added sugar.  Shop around the outside edge of the grocery store. This includes fresh fruits and vegetables, bulk grains, fresh meats, and fresh dairy. Cooking  Use low-heat cooking methods, such as baking, instead of high-heat cooking methods like deep frying.  Cook using healthy oils, such as olive, canola, or sunflower oil.  Avoid cooking with butter, cream, or high-fat meats. Meal planning  Eat meals and snacks regularly, preferably at the same times every day. Avoid going long periods of time without eating.  Eat foods high in fiber, such as fresh fruits, vegetables, beans, and whole grains. Talk to your dietitian about how many servings of carbohydrates you can eat at each meal.  Eat 4-6 ounces of lean protein each day, such as lean meat, chicken, fish, eggs, or tofu. 1 ounce is equal to 1 ounce of meat, chicken, or fish, 1 egg, or 1/4 cup of tofu.  Eat some foods each day that contain healthy fats, such as avocado, nuts, seeds, and fish. Lifestyle   Check your blood glucose regularly.  Exercise at least 30 minutes 5 or more days each week, or as told by your health care provider.  Take medicines as told by your health care provider.  Do not use any products that contain nicotine or tobacco, such as cigarettes and e-cigarettes. If you need help quitting, ask your health care provider.  Work with a counselor or diabetes educator to identify strategies to manage stress and any emotional and social challenges. What are some questions to ask my health care provider?  Do I need to meet with a diabetes educator?  Do I need to meet with a dietitian?  What number can I call if I have questions?  When are the best times to check my blood glucose? Where to find more information:  American Diabetes Association: diabetes.org/food-and-fitness/food  Academy of Nutrition and Dietetics:  www.eatright.org/resources/health/diseases-and-conditions/diabetes  National Institute of Diabetes and Digestive and Kidney Diseases (NIH): www.niddk.nih.gov/health-information/diabetes/overview/diet-eating-physical-activity Summary  A healthy meal plan will help you control your blood glucose and maintain a healthy lifestyle.  Working with a diet and nutrition specialist (dietitian) can help you make a meal plan that is best for you.  Keep in mind that carbohydrates and alcohol have immediate effects on your blood glucose levels. It is important to count carbohydrates and to use alcohol carefully. This information is not intended to replace advice given to you by your health care provider. Make sure you discuss any questions you have with your health care provider. Document Released: 01/27/2005 Document Revised: 06/06/2016 Document Reviewed: 06/06/2016 Elsevier Interactive Patient Education  2018 [employer]  

## 2017-06-03 ENCOUNTER — Other Ambulatory Visit: Payer: Self-pay | Admitting: Family Medicine

## 2017-06-07 ENCOUNTER — Other Ambulatory Visit: Payer: Self-pay | Admitting: *Deleted

## 2017-06-07 DIAGNOSIS — E119 Type 2 diabetes mellitus without complications: Secondary | ICD-10-CM

## 2017-06-07 MED ORDER — METFORMIN HCL ER 500 MG PO TB24: 500.0000 mg | ORAL_TABLET | Freq: Every day | ORAL | 4 refills | Status: DC

## 2017-06-07 MED ORDER — GLUCOSE BLOOD VI STRP: ORAL_STRIP | 4 refills | Status: DC

## 2017-06-14 DIAGNOSIS — M1712 Unilateral primary osteoarthritis, left knee: Secondary | ICD-10-CM | POA: Insufficient documentation

## 2017-07-13 ENCOUNTER — Other Ambulatory Visit: Payer: Self-pay | Admitting: Family Medicine

## 2017-07-13 DIAGNOSIS — Z1239 Encounter for other screening for malignant neoplasm of breast: Secondary | ICD-10-CM

## 2017-07-14 ENCOUNTER — Telehealth: Payer: Self-pay

## 2017-07-14 DIAGNOSIS — M25471 Effusion, right ankle: Secondary | ICD-10-CM

## 2017-07-14 DIAGNOSIS — F330 Major depressive disorder, recurrent, mild: Secondary | ICD-10-CM

## 2017-07-14 DIAGNOSIS — F339 Major depressive disorder, recurrent, unspecified: Secondary | ICD-10-CM

## 2017-07-14 DIAGNOSIS — E785 Hyperlipidemia, unspecified: Secondary | ICD-10-CM

## 2017-07-14 DIAGNOSIS — E119 Type 2 diabetes mellitus without complications: Secondary | ICD-10-CM

## 2017-07-14 DIAGNOSIS — I10 Essential (primary) hypertension: Secondary | ICD-10-CM

## 2017-07-14 MED ORDER — METFORMIN HCL ER 500 MG PO TB24: 500.0000 mg | ORAL_TABLET | Freq: Every day | ORAL | 0 refills | Status: DC

## 2017-07-14 MED ORDER — SIMVASTATIN 40 MG PO TABS: 40.0000 mg | ORAL_TABLET | Freq: Every day | ORAL | 0 refills | Status: DC

## 2017-07-14 MED ORDER — LOSARTAN POTASSIUM-HCTZ 100-25 MG PO TABS: 1.0000 | ORAL_TABLET | Freq: Every day | ORAL | 0 refills | Status: DC

## 2017-07-14 MED ORDER — MELOXICAM 7.5 MG PO TABS: 7.5000 mg | ORAL_TABLET | Freq: Every day | ORAL | 0 refills | Status: DC

## 2017-07-14 MED ORDER — VENLAFAXINE HCL ER 75 MG PO CP24: 75.0000 mg | ORAL_CAPSULE | Freq: Every day | ORAL | 1 refills | Status: DC

## 2017-07-14 MED ORDER — VENLAFAXINE HCL ER 75 MG PO CP24: 75.0000 mg | ORAL_CAPSULE | Freq: Every day | ORAL | 0 refills | Status: DC

## 2017-07-14 MED ORDER — SIMVASTATIN 40 MG PO TABS: 40.0000 mg | ORAL_TABLET | Freq: Every day | ORAL | 1 refills | Status: DC

## 2017-07-14 MED ORDER — LOSARTAN POTASSIUM-HCTZ 100-25 MG PO TABS
1.0000 | ORAL_TABLET | Freq: Every day | ORAL | 1 refills | Status: DC
Start: 2017-07-14 — End: 2017-07-14

## 2017-07-14 MED ORDER — MELOXICAM 7.5 MG PO TABS: 7.5000 mg | ORAL_TABLET | Freq: Every day | ORAL | 1 refills | Status: DC

## 2017-07-14 MED ORDER — METFORMIN HCL ER 500 MG PO TB24: 500.0000 mg | ORAL_TABLET | Freq: Every day | ORAL | 1 refills | Status: DC

## 2017-07-14 NOTE — Telephone Encounter (Signed)
Patient called in requesting a 2-3 week supply medication refills on the following:  Simvastatin Losartan HCTZ  Venlafaxine XR Metformin Meloxicam  Updated pharmacy information - Patient stated she will be in [place] for another 2-3 weeks.

## 2017-07-14 NOTE — Addendum Note (Signed)
Addended by: Nani GasserMETHENEY, CATHERINE D on: 07/14/2017 05:20 PM   Modules accepted: Orders

## 2017-07-14 NOTE — Telephone Encounter (Signed)
Med sent.  Mychart note sent.

## 2017-07-24 ENCOUNTER — Other Ambulatory Visit: Payer: Self-pay | Admitting: Family Medicine

## 2017-07-31 ENCOUNTER — Other Ambulatory Visit: Payer: Self-pay | Admitting: Family Medicine

## 2017-07-31 DIAGNOSIS — I10 Essential (primary) hypertension: Secondary | ICD-10-CM

## 2017-08-14 LAB — HM DIABETES EYE EXAM

## 2017-08-15 ENCOUNTER — Encounter: Payer: Self-pay | Admitting: Family Medicine

## 2017-08-30 ENCOUNTER — Ambulatory Visit (INDEPENDENT_AMBULATORY_CARE_PROVIDER_SITE_OTHER): Payer: BLUE CROSS/BLUE SHIELD

## 2017-08-30 DIAGNOSIS — Z1231 Encounter for screening mammogram for malignant neoplasm of breast: Secondary | ICD-10-CM | POA: Diagnosis not present

## 2017-08-30 DIAGNOSIS — Z1239 Encounter for other screening for malignant neoplasm of breast: Secondary | ICD-10-CM

## 2017-09-04 ENCOUNTER — Encounter: Payer: Self-pay | Admitting: Family Medicine

## 2017-09-04 ENCOUNTER — Ambulatory Visit: Payer: BLUE CROSS/BLUE SHIELD | Admitting: Family Medicine

## 2017-09-04 VITALS — BP 121/62 | HR 76 | Ht 64.0 in | Wt 249.0 lb

## 2017-09-04 DIAGNOSIS — E119 Type 2 diabetes mellitus without complications: Secondary | ICD-10-CM | POA: Diagnosis not present

## 2017-09-04 DIAGNOSIS — Z6841 Body Mass Index (BMI) 40.0 and over, adult: Secondary | ICD-10-CM | POA: Diagnosis not present

## 2017-09-04 DIAGNOSIS — I10 Essential (primary) hypertension: Secondary | ICD-10-CM

## 2017-09-04 LAB — BASIC METABOLIC PANEL WITH GFR
BUN: 22 mg/dL (ref 7–25)
CO2: 27 mmol/L (ref 20–32)
Calcium: 10.1 mg/dL (ref 8.6–10.4)
Chloride: 103 mmol/L (ref 98–110)
Creat: 0.91 mg/dL (ref 0.50–0.99)
GFR, Est African American: 78 mL/min/{1.73_m2} (ref >=60)
GFR, Est Non African American: 67 mL/min/{1.73_m2} (ref >=60)
Glucose, Bld: 103 mg/dL — ABNORMAL HIGH (ref 65–99)
Potassium: 3.8 mmol/L (ref 3.5–5.3)
Sodium: 140 mmol/L (ref 135–146)

## 2017-09-04 LAB — POCT GLYCOSYLATED HEMOGLOBIN (HGB A1C): Hemoglobin A1C: 5.5

## 2017-09-04 NOTE — Progress Notes (Signed)
All labs are normal. 

## 2017-09-04 NOTE — Progress Notes (Signed)
Subjective:    CC: DM, HTN  HPI:  Hypertension- Pt denies chest pain, SOB, dizziness, or heart palpitations.  Taking meds as directed w/o problems.  Denies medication side effects.    Diabetes - no hypoglycemic events. No wounds or sores that are not healing well. No increased thirst or urination. Checking glucose at home. Taking medications as prescribed without any side effects. She is on metformin.    BMI 42 -she has done great has lost 14 pounds in the last saw her in January.  She did have a nutrition consult. She has a follow up coming up.   Past medical history, Surgical history, Family history not pertinant except as noted below, Social history, Allergies, and medications have been entered into the medical record, reviewed, and corrections made.   Review of Systems: No fevers, chills, night sweats, weight loss, chest pain, or shortness of breath.   Objective:    General: Well Developed, well nourished, and in no acute distress.  Neuro: Alert and oriented x3, extra-ocular muscles intact, sensation grossly intact.  HEENT: Normocephalic, atraumatic  Skin: Warm and dry, no rashes. Cardiac: Regular rate and rhythm, no murmurs rubs or gallops, no lower extremity edema.  Respiratory: Clear to auscultation bilaterally. Not using accessory muscles, speaking in full sentences.   Impression and Recommendations:     HTN - Well controlled. Continue current regimen. Follow up in  4-6 months. Foot exam performed today.   DM - Well controlled. Great improvement!!!! Keep up the good work.  Continue current regimen. Follow up in  4-6 months.    DM I 42-congratulated her on weight loss and on reduction in her A1c which looks fantastic.

## 2017-09-19 ENCOUNTER — Other Ambulatory Visit: Payer: Self-pay | Admitting: Family Medicine

## 2017-09-19 DIAGNOSIS — M25471 Effusion, right ankle: Secondary | ICD-10-CM

## 2017-12-02 ENCOUNTER — Other Ambulatory Visit: Payer: Self-pay | Admitting: Family Medicine

## 2017-12-02 DIAGNOSIS — F339 Major depressive disorder, recurrent, unspecified: Secondary | ICD-10-CM

## 2017-12-04 NOTE — Telephone Encounter (Signed)
Last OV 09/04/17. Follow up for 12/25/17. Please advise.

## 2017-12-25 ENCOUNTER — Ambulatory Visit: Payer: BLUE CROSS/BLUE SHIELD | Admitting: Family Medicine

## 2018-01-02 ENCOUNTER — Other Ambulatory Visit: Payer: Self-pay | Admitting: Family Medicine

## 2018-01-04 ENCOUNTER — Ambulatory Visit: Payer: BLUE CROSS/BLUE SHIELD | Admitting: Family Medicine

## 2018-01-16 ENCOUNTER — Ambulatory Visit: Payer: BLUE CROSS/BLUE SHIELD | Admitting: Family Medicine

## 2018-01-25 ENCOUNTER — Encounter: Payer: Self-pay | Admitting: Family Medicine

## 2018-01-25 ENCOUNTER — Ambulatory Visit (INDEPENDENT_AMBULATORY_CARE_PROVIDER_SITE_OTHER): Payer: BLUE CROSS/BLUE SHIELD | Admitting: Family Medicine

## 2018-01-25 VITALS — BP 129/67 | HR 101 | Temp 98.4°F | Ht 64.0 in | Wt 252.0 lb

## 2018-01-25 DIAGNOSIS — E119 Type 2 diabetes mellitus without complications: Secondary | ICD-10-CM

## 2018-01-25 DIAGNOSIS — J0100 Acute maxillary sinusitis, unspecified: Secondary | ICD-10-CM

## 2018-01-25 DIAGNOSIS — Z23 Encounter for immunization: Secondary | ICD-10-CM

## 2018-01-25 DIAGNOSIS — E785 Hyperlipidemia, unspecified: Secondary | ICD-10-CM

## 2018-01-25 DIAGNOSIS — E559 Vitamin D deficiency, unspecified: Secondary | ICD-10-CM

## 2018-01-25 DIAGNOSIS — I10 Essential (primary) hypertension: Secondary | ICD-10-CM

## 2018-01-25 LAB — POCT GLYCOSYLATED HEMOGLOBIN (HGB A1C): Hemoglobin A1C: 5.4 % (ref 4.0–5.6)

## 2018-01-25 MED ORDER — AZITHROMYCIN 250 MG PO TABS: ORAL_TABLET | ORAL | 0 refills | Status: DC

## 2018-01-25 MED ORDER — AZITHROMYCIN 250 MG PO TABS: ORAL_TABLET | ORAL | 0 refills | Status: AC

## 2018-01-25 NOTE — Progress Notes (Signed)
Subjective:    Patient ID: Julia Woods, female    DOB: 02-13-1956, 62 y.o.   MRN: [id number]  HPI Hypertension- Pt denies chest pain, SOB, dizziness, or heart palpitations.  Taking meds as directed w/o problems.  Denies medication side effects.    Diabetes - no hypoglycemic events. No wounds or sores that are not healing well. No increased thirst or urination. Checking glucose at home. Taking medications as prescribed without any side effects.  Is on metformin 500 mg extended release daily. .  Patient comes in today complaining of sinus symptoms as well x 2 week.  She is been trying over-the-counter Mucinex.  She complains of her congestion and mucus is thick but yet clear.  Had some pressure in her ears as well as some itching.  She is had some frontal headaches and some pain over the maxillary sinuses.  No fevers chills or sweats.   Review of Systems  BP 129/67   Pulse (!) 101   Temp 98.4 F (36.9 C) (Oral)   Ht _0  (1.626 m)   Wt 252 lb (114.3 kg)   LMP 11/13/2008   SpO2 94%   BMI 43.26 kg/m     Allergies  Allergen Reactions  . Penicillins Hives    Past Medical History:  Diagnosis Date  . Allergy    allergic rhinitis  . Hyperlipidemia   . Hypertension   . Lumbar herniated disc 06/2012   injection, [facility]  . Menopausal state   . Mental disorder    depression  . S/P breast biopsy 1990, 12-2007   benign/Dr. Donne Hazel at [place]  . Sleep apnea     Past Surgical History:  Procedure Laterality Date  . ANKLE FRACTURE SURGERY  10-2004   does have hardware/RT  . BREAST BIOPSY  1990   8/09 benign/Dr Donne Hazel @ [facility]  . BREAST EXCISIONAL BIOPSY Left   . FOOT FRACTURE SURGERY  11-2001   RT  . KNEE ARTHROSCOPY  07-2008   RT/Dr. Maureen Ralphs  . REPLACEMENT TOTAL KNEE  03-2010   RT/Dr. Maureen Ralphs  . STEROID INJECTION TO SCAR     lumbar spine    Social History   Socioeconomic History  . Marital status: Married    Spouse name: Not on file  . Number of children: Not on file   . Years of education: Not on file  . Highest education level: Not on file  Occupational History  . Not on file  Social Needs  . Financial resource strain: Not on file  . Food insecurity:    Worry: Not on file    Inability: Not on file  . Transportation needs:    Medical: Not on file    Non-medical: Not on file  Tobacco Use  . Smoking status: Never Smoker  . Smokeless tobacco: Never Used  Substance and Sexual Activity  . Alcohol use: No  . Drug use: No  . 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  Lifestyle  . Physical activity:    Days per week: Not on file    Minutes per session: Not on file  . Stress: Not on file  Relationships  . Social connections:    Talks on phone: Not on file    Gets together: Not on file    Attends religious service: Not on file    Active member of club or organization: Not on file  Attends meetings of clubs or organizations: Not on file    Relationship status: Not on file  . Intimate partner violence:    Fear of current or ex partner: Not on file    Emotionally abused: Not on file    Physically abused: Not on file    Forced sexual activity: Not on file  Other Topics Concern  . Not on file  Social History Narrative  . Not on file    Family History  Problem Relation Age of Onset  . Cancer Mother 80       colon  . Diabetes Mother   . Stroke Mother   . Heart attack Mother   . Hyperlipidemia Mother   . Hypertension Mother   . Heart attack Father 48  . Hyperlipidemia Unknown        family history  . Hypertension Unknown        family history  . Stroke Unknown        1st degree relative < 50    Outpatient Encounter Medications as of 01/25/2018  Medication Sig  . AMBULATORY NON FORMULARY MEDICATION Medication Name:  CPAP machine including tubing, filter, humidifier, mask. See sleep study included w/order  Dx OSA -  G47.33  [phone]  . Blood Glucose Monitoring Suppl (ONE TOUCH ULTRA 2) w/Device KIT USE TO TEST GLUCOSE DAILY  . Cholecalciferol (VITAMIN D) 2000 units CAPS Take 1 capsule by mouth daily.  Marland Kitchen glucose blood test strip One touch ultra blue 25's for testing blood sugars once daily. Dx: e11.9  . losartan-hydrochlorothiazide (HYZAAR) 100-25 MG tablet TAKE 1 TABLET DAILY  . meloxicam (MOBIC) 7.5 MG tablet TAKE 1 TABLET DAILY  . metFORMIN (GLUCOPHAGE-XR) 500 MG 24 hr tablet Take 1 tablet (500 mg total) by mouth daily with breakfast.  . Multiple Vitamin (MULTIVITAMIN) tablet Take 1 tablet by mouth daily.    [name] 09X MISC USE TO TEST DAILY  . simvastatin (ZOCOR) 40 MG tablet TAKE 1 TABLET AT BEDTIME  . valACYclovir (VALTREX) 1000 MG tablet TAKE 1 TABLET TWICE A DAY AS NEEDED  . venlafaxine XR (EFFEXOR-XR) 75 MG 24 hr capsule TAKE 1 CAPSULE DAILY  . XIIDRA 5 % SOLN Place into both eyes.  . [DISCONTINUED] aspirin 81 MG chewable tablet Chew 81 mg by mouth daily.     No facility-administered encounter medications on file as of 01/25/2018.          Objective:   Physical Exam  Constitutional: She is oriented to person, place, and time. She appears well-developed and well-nourished.  HENT:  Head: Normocephalic and atraumatic.  Right Ear: External ear normal.  Left Ear: External ear normal.  Nose: Nose normal.  Mouth/Throat: Oropharynx is clear and moist.  TMs and canals are clear.   Eyes: Pupils are equal, round, and reactive to light. Conjunctivae and EOM are normal.  Neck: Neck supple. No thyromegaly present.  Cardiovascular: Normal rate, regular rhythm and normal heart sounds.  Pulmonary/Chest: Effort normal and breath sounds normal. She has no wheezes.  Lymphadenopathy:    She has no cervical adenopathy.  Neurological: She is alert and oriented to person, place, and time.  Skin: Skin is warm and dry.  Psychiatric: She has a normal mood and affect.           Assessment & Plan:  Acute sinusitis -we will go ahead and treat with azithromycin.  I do think there may be some allergic component to this that she does have some ear  itching recommend that she try an over-the-counter Claritin as well.  DM, well controlled.  -to look fantastic with him globin A1c of 5.4 today.  In fact is been consistently low for the last several times that she is been here.  Will discontinue metformin for now and have her follow-up for 4 months for repeat A1c.  HTN - Well controlled. Continue current regimen. Follow up in  6 months.

## 2018-01-25 NOTE — Patient Instructions (Signed)
Ok to stop your metformin.

## 2018-01-27 ENCOUNTER — Other Ambulatory Visit: Payer: Self-pay | Admitting: Family Medicine

## 2018-01-27 DIAGNOSIS — I10 Essential (primary) hypertension: Secondary | ICD-10-CM

## 2018-03-02 LAB — COMPLETE METABOLIC PANEL WITH GFR
AG Ratio: 1.6 (calc) (ref 1.0–2.5)
ALT: 16 U/L (ref 6–29)
AST: 16 U/L (ref 10–35)
Albumin: 3.9 g/dL (ref 3.6–5.1)
Alkaline phosphatase (APISO): 79 U/L (ref 33–130)
BUN/Creatinine Ratio: 29 (calc) — ABNORMAL HIGH (ref 6–22)
BUN: 26 mg/dL — ABNORMAL HIGH (ref 7–25)
CO2: 24 mmol/L (ref 20–32)
Calcium: 9.4 mg/dL (ref 8.6–10.4)
Chloride: 106 mmol/L (ref 98–110)
Creat: 0.91 mg/dL (ref 0.50–0.99)
GFR, Est African American: 77 mL/min/{1.73_m2} (ref >=60)
GFR, Est Non African American: 67 mL/min/{1.73_m2} (ref >=60)
Globulin: 2.5 g/dL (calc) (ref 1.9–3.7)
Glucose, Bld: 96 mg/dL (ref 65–99)
Potassium: 3.9 mmol/L (ref 3.5–5.3)
Sodium: 141 mmol/L (ref 135–146)
Total Bilirubin: 0.9 mg/dL (ref 0.2–1.2)
Total Protein: 6.4 g/dL (ref 6.1–8.1)

## 2018-03-02 LAB — LIPID PANEL
Cholesterol: 216 mg/dL — ABNORMAL HIGH (ref <200)
HDL: 77 mg/dL (ref >50)
LDL Cholesterol (Calc): 118 mg/dL (calc) — ABNORMAL HIGH
Non-HDL Cholesterol (Calc): 139 mg/dL (calc) — ABNORMAL HIGH (ref <130)
Total CHOL/HDL Ratio: 2.8 (calc) (ref <5.0)
Triglycerides: 100 mg/dL (ref <150)

## 2018-03-02 LAB — VITAMIN D 25 HYDROXY (VIT D DEFICIENCY, FRACTURES): Vit D, 25-Hydroxy: 24 ng/mL — ABNORMAL LOW (ref 30–100)

## 2018-03-18 ENCOUNTER — Other Ambulatory Visit: Payer: Self-pay | Admitting: Family Medicine

## 2018-03-18 DIAGNOSIS — M25471 Effusion, right ankle: Secondary | ICD-10-CM

## 2018-05-28 ENCOUNTER — Ambulatory Visit: Payer: BLUE CROSS/BLUE SHIELD | Admitting: Family Medicine

## 2018-06-02 ENCOUNTER — Other Ambulatory Visit: Payer: Self-pay | Admitting: Family Medicine

## 2018-06-02 DIAGNOSIS — F339 Major depressive disorder, recurrent, unspecified: Secondary | ICD-10-CM

## 2018-06-29 ENCOUNTER — Other Ambulatory Visit: Payer: Self-pay | Admitting: Family Medicine

## 2018-06-29 ENCOUNTER — Encounter: Payer: Self-pay | Admitting: *Deleted

## 2018-09-21 ENCOUNTER — Ambulatory Visit: Payer: BLUE CROSS/BLUE SHIELD | Admitting: Family Medicine

## 2018-09-24 ENCOUNTER — Other Ambulatory Visit: Payer: Self-pay | Admitting: Family Medicine

## 2018-09-24 DIAGNOSIS — Z1231 Encounter for screening mammogram for malignant neoplasm of breast: Secondary | ICD-10-CM

## 2018-10-03 ENCOUNTER — Ambulatory Visit (INDEPENDENT_AMBULATORY_CARE_PROVIDER_SITE_OTHER): Payer: BLUE CROSS/BLUE SHIELD

## 2018-10-03 ENCOUNTER — Other Ambulatory Visit: Payer: Self-pay

## 2018-10-03 DIAGNOSIS — Z1231 Encounter for screening mammogram for malignant neoplasm of breast: Secondary | ICD-10-CM

## 2018-10-05 ENCOUNTER — Other Ambulatory Visit: Payer: Self-pay | Admitting: Family Medicine

## 2018-10-05 DIAGNOSIS — R928 Other abnormal and inconclusive findings on diagnostic imaging of breast: Secondary | ICD-10-CM

## 2018-10-11 ENCOUNTER — Other Ambulatory Visit: Payer: Self-pay

## 2018-10-11 ENCOUNTER — Ambulatory Visit
Admission: RE | Admit: 2018-10-11 | Discharge: 2018-10-11 | Disposition: A | Discharge status: Home / Self Care | Payer: BLUE CROSS/BLUE SHIELD | Source: Ambulatory Visit | Attending: Family Medicine | Admitting: Family Medicine

## 2018-10-11 DIAGNOSIS — R928 Other abnormal and inconclusive findings on diagnostic imaging of breast: Secondary | ICD-10-CM

## 2018-10-29 ENCOUNTER — Encounter: Payer: Self-pay | Admitting: Family Medicine

## 2018-10-29 ENCOUNTER — Ambulatory Visit (INDEPENDENT_AMBULATORY_CARE_PROVIDER_SITE_OTHER): Payer: BC Managed Care – PPO | Admitting: Family Medicine

## 2018-10-29 VITALS — BP 124/75 | HR 87 | Ht 64.0 in | Wt 253.0 lb

## 2018-10-29 DIAGNOSIS — E559 Vitamin D deficiency, unspecified: Secondary | ICD-10-CM

## 2018-10-29 DIAGNOSIS — F330 Major depressive disorder, recurrent, mild: Secondary | ICD-10-CM

## 2018-10-29 DIAGNOSIS — Z23 Encounter for immunization: Secondary | ICD-10-CM

## 2018-10-29 DIAGNOSIS — E119 Type 2 diabetes mellitus without complications: Secondary | ICD-10-CM

## 2018-10-29 DIAGNOSIS — I10 Essential (primary) hypertension: Secondary | ICD-10-CM | POA: Diagnosis not present

## 2018-10-29 LAB — POCT GLYCOSYLATED HEMOGLOBIN (HGB A1C): Hemoglobin A1C: 5.6 % (ref 4.0–5.6)

## 2018-10-29 NOTE — Progress Notes (Signed)
Established Patient Office Visit  Subjective:  Patient ID: Julia Woods, female    DOB: 17-May-1955  Age: 63 y.o. MRN: [id number]  CC:  Chief Complaint  Patient presents with  . Hypertension    HPI Amit Meloy presents for   Hypertension- Pt denies chest pain, SOB, dizziness, or heart palpitations.  Taking meds as directed w/o problems.  Denies medication side effects.   Diabetes - no hypoglycemic events. No wounds or sores that are not healing well. No increased thirst or urination. Checking glucose at home. Taking medications as prescribed without any side effects.  F/U depression  -he is actually doing really well overall.  Currently on Effexor she is happy with her current dosing regimen.  She was out visiting her son out of state when the [place] hit and she ended up having to stay there for a few more weeks.  Past Medical History:  Diagnosis Date  . Allergy    allergic rhinitis  . Hyperlipidemia   . Hypertension   . Lumbar herniated disc 06/2012   injection, [facility]  . Menopausal state   . Mental disorder    depression  . S/P breast biopsy 1990, 12-2007   benign/Dr. Donne Hazel at [facility]  . Sleep apnea     Past Surgical History:  Procedure Laterality Date  . ANKLE FRACTURE SURGERY  10-2004   does have hardware/RT  . BREAST BIOPSY  1990   8/09 benign/Dr Donne Hazel @ [facility]  . BREAST EXCISIONAL BIOPSY Left   . FOOT FRACTURE SURGERY  11-2001   RT  . KNEE ARTHROSCOPY  07-2008   RT/Dr. Maureen Ralphs  . REPLACEMENT TOTAL KNEE  03-2010   RT/Dr. Maureen Ralphs  . STEROID INJECTION TO SCAR     lumbar spine    Family History  Problem Relation Age of Onset  . Cancer Mother 19       colon  . Diabetes Mother   . Stroke Mother   . Heart attack Mother   . Hyperlipidemia Mother   . Hypertension Mother   . Heart attack Father 81  . Hyperlipidemia Other        family history  . Hypertension Other        family history  . Stroke Other        1st degree relative < 50    Social History    Socioeconomic History  . Marital status: Married    Spouse name: Not on file  . Number of children: Not on file  . Years of education: Not on file  . Highest education level: Not on file  Occupational History  . Not on file  Social Needs  . Financial resource strain: Not on file  . Food insecurity    Worry: Not on file    Inability: Not on file  . Transportation needs    Medical: Not on file    Non-medical: Not on file  Tobacco Use  . Smoking status: Never Smoker  . Smokeless tobacco: Never Used  Substance and Sexual Activity  . Alcohol use: No  . Drug use: No  . 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  Lifestyle  . Physical activity    Days per week: Not on file    Minutes per session: Not on file  . Stress: Not on file  Relationships  . Social connections    Talks on phone: Not on  file    Gets together: Not on file    Attends religious service: Not on file    Active member of club or organization: Not on file    Attends meetings of clubs or organizations: Not on file    Relationship status: Not on file  . Intimate partner violence    Fear of current or ex partner: Not on file    Emotionally abused: Not on file    Physically abused: Not on file    Forced sexual activity: Not on file  Other Topics Concern  . Not on file  Social History Narrative  . Not on file    Outpatient Medications Prior to Visit  Medication Sig Dispense Refill  . AMBULATORY NON FORMULARY MEDICATION Medication Name:  CPAP machine including tubing, filter, humidifier, mask. See sleep study included w/order  Dx OSA - G47.33  FAX:[phone] 1 Units 0  . Blood Glucose Monitoring Suppl (ONE TOUCH ULTRA 2) w/Device KIT USE TO TEST GLUCOSE DAILY  0  . Cholecalciferol (VITAMIN D) 2000 units CAPS Take 1 capsule by mouth daily.    Marland Kitchen glucose blood test strip One touch ultra  blue 25's for testing blood sugars once daily. Dx: e11.9 100 each 4  . losartan-hydrochlorothiazide (HYZAAR) 100-25 MG tablet TAKE 1 TABLET DAILY 90 tablet 4  . meloxicam (MOBIC) 7.5 MG tablet TAKE 1 TABLET DAILY 90 tablet 4  . metFORMIN (GLUCOPHAGE-XR) 500 MG 24 hr tablet Take 1 tablet by mouth daily.    . Multiple Vitamin (MULTIVITAMIN) tablet Take 1 tablet by mouth daily.      [name] 07M MISC USE TO TEST DAILY  0  . simvastatin (ZOCOR) 40 MG tablet TAKE 1 TABLET AT BEDTIME 90 tablet 4  . valACYclovir (VALTREX) 1000 MG tablet TAKE 1 TABLET TWICE A DAY AS NEEDED 180 tablet 4  . venlafaxine XR (EFFEXOR-XR) 75 MG 24 hr capsule TAKE 1 CAPSULE DAILY 90 capsule 4  . XIIDRA 5 % SOLN Place into both eyes.     No facility-administered medications prior to visit.     Allergies  Allergen Reactions  . Penicillins Hives    ROS Review of Systems    Objective:    Physical Exam  Constitutional: She is oriented to person, place, and time. She appears well-developed and well-nourished.  HENT:  Head: Normocephalic and atraumatic.  Cardiovascular: Normal rate, regular rhythm and normal heart sounds.  Pulmonary/Chest: Effort normal and breath sounds normal.  Neurological: She is alert and oriented to person, place, and time.  Skin: Skin is warm and dry.  Psychiatric: She has a normal mood and affect. Her behavior is normal.    BP 124/75   Pulse 87   Ht 5' 4"  (1.626 m)   Wt 253 lb (114.8 kg)   LMP 11/13/2008   SpO2 96%   BMI 43.43 kg/m  Wt Readings from Last 3 Encounters:  10/29/18 253 lb (114.8 kg)  01/25/18 252 lb (114.3 kg)  09/04/17 249 lb (112.9 kg)     Health Maintenance Due  Topic Date Due  . HEMOGLOBIN A1C  07/26/2018  . OPHTHALMOLOGY EXAM  08/15/2018  . FOOT EXAM  09/05/2018  . TETANUS/TDAP  10/15/2018    There are no preventive care reminders to display for this patient.  Lab Results  Component Value Date   TSH 1.766 09/20/2013   Lab Results   Component Value Date   WBC 5.6 10/27/2010   HGB 14.9 10/27/2010   HCT 44.6  10/27/2010   MCV 91.6 10/27/2010   PLT 276 10/27/2010   Lab Results  Component Value Date   NA 141 03/01/2018   K 3.9 03/01/2018   CO2 24 03/01/2018   GLUCOSE 96 03/01/2018   BUN 26 (H) 03/01/2018   CREATININE 0.91 03/01/2018   BILITOT 0.9 03/01/2018   ALKPHOS 87 11/22/2016   AST 16 03/01/2018   ALT 16 03/01/2018   PROT 6.4 03/01/2018   ALBUMIN 4.3 11/22/2016   CALCIUM 9.4 03/01/2018   Lab Results  Component Value Date   CHOL 216 (H) 03/01/2018   Lab Results  Component Value Date   HDL 77 03/01/2018   Lab Results  Component Value Date   LDLCALC 118 (H) 03/01/2018   Lab Results  Component Value Date   TRIG 100 03/01/2018   Lab Results  Component Value Date   CHOLHDL 2.8 03/01/2018   Lab Results  Component Value Date   HGBA1C 5.6 10/29/2018      Assessment & Plan:   Problem List Items Addressed This Visit      Cardiovascular and Mediastinum   HYPERTENSION, BENIGN    Well controlled. Continue current regimen. Follow up in  4-5 months.        Relevant Orders   BASIC METABOLIC PANEL WITH GFR     Endocrine   Controlled type 2 diabetes mellitus without complication, without long-term current use of insulin (HCC) - Primary    A1c looks fantastic today at 5.6.  Continue current regimen and stay active.  And follow-up in 4 to 6 months.      Relevant Medications   metFORMIN (GLUCOPHAGE-XR) 500 MG 24 hr tablet   Other Relevant Orders   POCT glycosylated hemoglobin (Hb A1C) (Completed)   BASIC METABOLIC PANEL WITH GFR     Other   Depression, major, recurrent (HCC)    Other Visit Diagnoses    Vitamin D deficiency       Relevant Orders   VITAMIN D 25 Hydroxy (Vit-D Deficiency, Fractures)   Need for tetanus, diphtheria, and acellular pertussis (Tdap) vaccine in patient of adolescent age or older       Relevant Orders   Tdap vaccine greater than or equal to 7yo IM (Completed)       No orders of the defined types were placed in this encounter.   Follow-up: Return in about 6 months (around 04/30/2019) for Diabetes follow-up, Hypertension.    Beatrice Lecher, MD

## 2018-10-29 NOTE — Assessment & Plan Note (Signed)
Well controlled. Continue current regimen. Follow up in  4-5 months.

## 2018-10-29 NOTE — Assessment & Plan Note (Signed)
A1c looks fantastic today at 5.6.  Continue current regimen and stay active.  And follow-up in 4 to 6 months.

## 2018-10-30 ENCOUNTER — Other Ambulatory Visit: Payer: Self-pay | Admitting: *Deleted

## 2018-10-30 DIAGNOSIS — R7989 Other specified abnormal findings of blood chemistry: Secondary | ICD-10-CM

## 2018-10-30 LAB — BASIC METABOLIC PANEL WITH GFR
BUN/Creatinine Ratio: 18 (calc) (ref 6–22)
BUN: 20 mg/dL (ref 7–25)
CO2: 28 mmol/L (ref 20–32)
Calcium: 10.2 mg/dL (ref 8.6–10.4)
Chloride: 101 mmol/L (ref 98–110)
Creat: 1.1 mg/dL — ABNORMAL HIGH (ref 0.50–0.99)
GFR, Est African American: 61 mL/min/{1.73_m2} (ref >=60)
GFR, Est Non African American: 53 mL/min/{1.73_m2} — ABNORMAL LOW (ref >=60)
Glucose, Bld: 107 mg/dL — ABNORMAL HIGH (ref 65–99)
Potassium: 3.7 mmol/L (ref 3.5–5.3)
Sodium: 140 mmol/L (ref 135–146)

## 2018-10-30 LAB — VITAMIN D 25 HYDROXY (VIT D DEFICIENCY, FRACTURES): Vit D, 25-Hydroxy: 30 ng/mL (ref 30–100)

## 2019-01-18 ENCOUNTER — Other Ambulatory Visit: Payer: Self-pay | Admitting: *Deleted

## 2019-01-18 DIAGNOSIS — I10 Essential (primary) hypertension: Secondary | ICD-10-CM

## 2019-01-18 DIAGNOSIS — F339 Major depressive disorder, recurrent, unspecified: Secondary | ICD-10-CM

## 2019-01-18 DIAGNOSIS — E785 Hyperlipidemia, unspecified: Secondary | ICD-10-CM

## 2019-01-18 DIAGNOSIS — M25471 Effusion, right ankle: Secondary | ICD-10-CM

## 2019-01-18 MED ORDER — SIMVASTATIN 40 MG PO TABS: 40.0000 mg | ORAL_TABLET | Freq: Every day | ORAL | 4 refills | Status: DC

## 2019-01-18 MED ORDER — MELOXICAM 7.5 MG PO TABS: 7.5000 mg | ORAL_TABLET | Freq: Every day | ORAL | 4 refills | Status: DC

## 2019-01-18 MED ORDER — VENLAFAXINE HCL ER 75 MG PO CP24: 75.0000 mg | ORAL_CAPSULE | Freq: Every day | ORAL | 4 refills | Status: DC

## 2019-01-18 MED ORDER — VALACYCLOVIR HCL 1 G PO TABS: 1000.0000 mg | ORAL_TABLET | Freq: Two times a day (BID) | ORAL | 4 refills | Status: DC | PRN

## 2019-01-18 MED ORDER — LOSARTAN POTASSIUM-HCTZ 100-25 MG PO TABS: 1.0000 | ORAL_TABLET | Freq: Every day | ORAL | 4 refills | Status: DC

## 2019-01-28 ENCOUNTER — Other Ambulatory Visit: Payer: Self-pay

## 2019-01-28 ENCOUNTER — Ambulatory Visit (INDEPENDENT_AMBULATORY_CARE_PROVIDER_SITE_OTHER): Payer: Medicare Other | Admitting: Family Medicine

## 2019-01-28 DIAGNOSIS — Z23 Encounter for immunization: Secondary | ICD-10-CM

## 2019-01-28 DIAGNOSIS — R7989 Other specified abnormal findings of blood chemistry: Secondary | ICD-10-CM

## 2019-01-29 LAB — BASIC METABOLIC PANEL WITH GFR
BUN/Creatinine Ratio: 33 (calc) — ABNORMAL HIGH (ref 6–22)
BUN: 29 mg/dL — ABNORMAL HIGH (ref 7–25)
CO2: 27 mmol/L (ref 20–32)
Calcium: 9.6 mg/dL (ref 8.6–10.4)
Chloride: 105 mmol/L (ref 98–110)
Creat: 0.88 mg/dL (ref 0.50–0.99)
GFR, Est African American: 80 mL/min/{1.73_m2} (ref >=60)
GFR, Est Non African American: 69 mL/min/{1.73_m2} (ref >=60)
Glucose, Bld: 104 mg/dL — ABNORMAL HIGH (ref 65–99)
Potassium: 3.9 mmol/L (ref 3.5–5.3)
Sodium: 142 mmol/L (ref 135–146)

## 2019-02-13 ENCOUNTER — Encounter: Payer: Self-pay | Admitting: Family Medicine

## 2019-03-25 ENCOUNTER — Other Ambulatory Visit: Payer: Self-pay | Admitting: Family Medicine

## 2019-03-25 DIAGNOSIS — E785 Hyperlipidemia, unspecified: Secondary | ICD-10-CM

## 2019-04-30 ENCOUNTER — Other Ambulatory Visit: Payer: Self-pay

## 2019-04-30 ENCOUNTER — Encounter: Payer: Self-pay | Admitting: Family Medicine

## 2019-04-30 ENCOUNTER — Ambulatory Visit (INDEPENDENT_AMBULATORY_CARE_PROVIDER_SITE_OTHER): Payer: Medicare Other | Admitting: Family Medicine

## 2019-04-30 VITALS — BP 139/82 | HR 98 | Ht 64.0 in | Wt 260.0 lb

## 2019-04-30 DIAGNOSIS — E559 Vitamin D deficiency, unspecified: Secondary | ICD-10-CM

## 2019-04-30 DIAGNOSIS — Z23 Encounter for immunization: Secondary | ICD-10-CM | POA: Diagnosis not present

## 2019-04-30 DIAGNOSIS — F330 Major depressive disorder, recurrent, mild: Secondary | ICD-10-CM | POA: Diagnosis not present

## 2019-04-30 DIAGNOSIS — E119 Type 2 diabetes mellitus without complications: Secondary | ICD-10-CM | POA: Diagnosis not present

## 2019-04-30 DIAGNOSIS — I10 Essential (primary) hypertension: Secondary | ICD-10-CM | POA: Diagnosis not present

## 2019-04-30 DIAGNOSIS — R002 Palpitations: Secondary | ICD-10-CM

## 2019-04-30 DIAGNOSIS — F339 Major depressive disorder, recurrent, unspecified: Secondary | ICD-10-CM

## 2019-04-30 LAB — POCT GLYCOSYLATED HEMOGLOBIN (HGB A1C): Hemoglobin A1C: 5.5 % (ref 4.0–5.6)

## 2019-04-30 MED ORDER — VENLAFAXINE HCL ER 150 MG PO CP24: 150.0000 mg | ORAL_CAPSULE | Freq: Every day | ORAL | 0 refills | Status: DC

## 2019-04-30 NOTE — Progress Notes (Signed)
Established Patient Office Visit  Subjective:  Patient ID: Julia Woods, female    DOB: 06-21-1955  Age: 63 y.o. MRN: [id number]  CC:  Chief Complaint  Patient presents with  . Diabetes  . Hypertension    HPI Julia Woods presents for   Diabetes - no hypoglycemic events. No wounds or sores that are not healing well. No increased thirst or urination. Checking glucose at home. Taking medications as prescribed without any side effects.  Due for foot exam Julia hemoglobin A1c today.  Hypertension- Pt denies chest pain, SOB, dizziness, or heart palpitations.  Taking meds as directed w/o problems.  Denies medication side effects.    F/U Depression, MDD -currently on Effexor.  Julia feels like Julia would like to adjust her medication.  Julia does not feel like her mood has been well controlled recently.  Julia has had a lot of stressors going on Julia Julia her husband are having some marital issues.  Julia will not be able to see her new grandbaby Julia that is been stressful for her as well.  Julia has been sleeping a lot.  Julia more recently decided to volunteer at the [employer] Julia has been picking up her grandkids a couple times a week Julia that has been helpful to get her out of the house Julia has been positive for her.  Julia is not currently involved in any therapy or counseling.  Follow-up vitamin D deficiency- due to recheck vitamin D    Julia does report that Julia is had a little bit of heart pounding.  Usually just last for few seconds.  Julia is noticing it particularly in the evening when Julia is lying in bed.  Julia has not had any changes in caffeine intake.  Julia does take her Mobic daily.  Has been under a lot of stress.   Past Medical History:  Diagnosis Date  . Allergy    allergic rhinitis  . Hyperlipidemia   . Hypertension   . Lumbar herniated disc 06/2012   injection, [facility]  . Menopausal state   . Mental disorder    depression  . S/P breast biopsy 1990, 12-2007   benign/Dr. Donne Hazel at [facility]  .  Sleep apnea     Past Surgical History:  Procedure Laterality Date  . ANKLE FRACTURE SURGERY  10-2004   does have hardware/RT  . BREAST BIOPSY  1990   8/09 benign/Dr Donne Hazel @ [facility]  . BREAST EXCISIONAL BIOPSY Left   . FOOT FRACTURE SURGERY  11-2001   RT  . KNEE ARTHROSCOPY  07-2008   RT/Dr. Maureen Ralphs  . REPLACEMENT TOTAL KNEE  03-2010   RT/Dr. Maureen Ralphs  . STEROID INJECTION TO SCAR     lumbar spine    Family History  Problem Relation Age of Onset  . Cancer Mother 77       colon  . Diabetes Mother   . Stroke Mother   . Heart attack Mother   . Hyperlipidemia Mother   . Hypertension Mother   . Heart attack Father 75  . Hyperlipidemia Other        family history  . Hypertension Other        family history  . Stroke Other        1st degree relative < 50    Social History   Socioeconomic History  . Marital status: Married    Spouse name: Not on file  . Number of children: Not on file  . Years of education:  Not on file  . Highest education level: Not on file  Occupational History  . Not on file  Tobacco Use  . Smoking status: Never Smoker  . Smokeless tobacco: Never Used  Substance Julia Sexual Activity  . Alcohol use: No  . Drug use: No  . 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  Other Topics Concern  . Not on file  Social History Narrative  . Not on file   Social Determinants of Health   Financial Resource Strain:   . Difficulty of Paying Living Expenses: Not on file  Food Insecurity:   . Worried About Charity fundraiser in the Last Year: Not on file  . Ran Out of Food in the Last Year: Not on file  Transportation Needs:   . Lack of Transportation (Medical): Not on file  . Lack of Transportation (Non-Medical): Not on file  Physical Activity:   . Days of Exercise per Week: Not on file  . Minutes of Exercise per Session: Not on file   Stress:   . Feeling of Stress : Not on file  Social Connections:   . Frequency of Communication with Friends Julia Family: Not on file  . Frequency of Social Gatherings with Friends Julia Family: Not on file  . Attends Religious Services: Not on file  . Active Member of Clubs or Organizations: Not on file  . Attends Archivist Meetings: Not on file  . Marital Status: Not on file  Intimate Partner Violence:   . Fear of Current or Ex-Partner: Not on file  . Emotionally Abused: Not on file  . Physically Abused: Not on file  . Sexually Abused: Not on file    Outpatient Medications Prior to Visit  Medication Sig Dispense Refill  . AMBULATORY NON FORMULARY MEDICATION Medication Name:  CPAP machine including tubing, filter, humidifier, mask. See sleep study included w/order  Dx OSA - G47.33  [phone] 1 Units 0  . Cholecalciferol (VITAMIN D) 2000 units CAPS Take 1 capsule by mouth daily.    Marland Kitchen losartan-hydrochlorothiazide (HYZAAR) 100-25 MG tablet Take 1 tablet by mouth daily. 90 tablet 4  . meloxicam (MOBIC) 7.5 MG tablet Take 1 tablet (7.5 mg total) by mouth daily. 90 tablet 4  . Multiple Vitamin (MULTIVITAMIN) tablet Take 1 tablet by mouth daily.      . simvastatin (ZOCOR) 40 MG tablet TAKE 1 TABLET AT BEDTIME 90 tablet 0  . valACYclovir (VALTREX) 1000 MG tablet Take 1 tablet (1,000 mg total) by mouth 2 (two) times daily as needed. 180 tablet 4  . XIIDRA 5 % SOLN Julia Woods into both eyes.    Marland Kitchen venlafaxine XR (EFFEXOR-XR) 75 MG 24 hr capsule Take 1 capsule (75 mg total) by mouth daily. 90 capsule 4  . Blood Glucose Monitoring Suppl (ONE TOUCH ULTRA 2) w/Device KIT USE TO TEST GLUCOSE DAILY  0  . glucose blood test strip One touch ultra blue 25's for testing blood sugars once daily. Dx: e11.9 100 each 4  . metFORMIN (GLUCOPHAGE-XR) 500 MG 24 hr tablet Take 1 tablet by mouth daily.    [name] 16X MISC USE TO TEST DAILY  0   No facility-administered medications  prior to visit.    Allergies  Allergen Reactions  . Penicillins Hives    ROS Review of Systems    Objective:    Physical Exam  Constitutional:  Julia Woods, Julia Woods, Julia Woods. Julia appears well-developed Julia well-nourished.  HENT:  Head: Normocephalic Julia atraumatic.  Neck: No thyromegaly present.  Cardiovascular: Normal rate, regular rhythm Julia normal heart sounds.  Pulmonary/Chest: Effort normal Julia breath sounds normal.  Musculoskeletal:     Cervical back: Neck supple.  Lymphadenopathy:    Julia has no cervical adenopathy.  Neurological: Julia is alert Julia oriented to Woods, Julia Woods, Julia Woods.  Skin: Skin is warm Julia dry.  Psychiatric: Julia has a normal mood Julia affect. Her behavior is normal.    BP 139/82   Pulse 98   Ht _0  (1.626 m)   Wt 260 lb (117.9 kg)   LMP 11/13/2008   SpO2 99%   BMI 44.63 kg/m  Wt Readings from Last 3 Encounters:  04/30/19 260 lb (117.9 kg)  10/29/18 253 lb (114.8 kg)  01/25/18 252 lb (114.3 kg)     Health Maintenance Due  Topic Date Due  . OPHTHALMOLOGY EXAM  08/15/2018  . FOOT EXAM  09/05/2018    There are no preventive care reminders to display for this patient.  Lab Results  Component Value Date   TSH 2.23 04/30/2019   Lab Results  Component Value Date   WBC 7.3 04/30/2019   HGB 15.5 04/30/2019   HCT 45.7 (H) 04/30/2019   MCV 89.1 04/30/2019   PLT 284 04/30/2019   Lab Results  Component Value Date   NA 140 04/30/2019   K 3.5 04/30/2019   CO2 28 04/30/2019   GLUCOSE 123 (H) 04/30/2019   BUN 23 04/30/2019   CREATININE 0.89 04/30/2019   BILITOT 0.9 04/30/2019   ALKPHOS 87 11/22/2016   AST 22 04/30/2019   ALT 24 04/30/2019   PROT 7.1 04/30/2019   ALBUMIN 4.3 11/22/2016   CALCIUM 10.1 04/30/2019   Lab Results  Component Value Date   CHOL 213 (H) 04/30/2019   Lab Results  Component Value Date   HDL 74 04/30/2019   Lab Results  Component Value Date   LDLCALC 117 (H) 04/30/2019   Lab Results   Component Value Date   TRIG 108 04/30/2019   Lab Results  Component Value Date   CHOLHDL 2.9 04/30/2019   Lab Results  Component Value Date   HGBA1C 5.5 04/30/2019      Assessment & Plan:   Problem List Items Addressed This Visit      Cardiovascular Julia Mediastinum   HYPERTENSION, BENIGN - Primary    BP borderline today.  Julia does take a chronic NSAID.  Encouraged her to monitor at home just to make sure it is not related to the palpitations Julia has been experiencing.      Relevant Orders   COMPLETE METABOLIC PANEL WITH GFR (Completed)   Lipid panel (Completed)   TSH (Completed)   CBC no Diff (Completed)     Endocrine   Controlled type 2 diabetes mellitus without complication, without long-term current use of insulin ([place])    Well controlled. Continue current regimen. Follow up in  4 mo      Relevant Orders   COMPLETE METABOLIC PANEL WITH GFR (Completed)   Lipid panel (Completed)   POCT HgB A1C (Completed)   TSH (Completed)   CBC no Diff (Completed)     Other   Depression, major, recurrent ([place])    Patient really struggling with mood.  We discussed options.  Will increase Effexor to 150 mg Julia also discussed referral to therapist.  Julia agreed so  referral was placed.  Follow-up in 2 to 3 months.      Relevant Medications   venlafaxine XR (EFFEXOR-XR) 150 MG 24 hr capsule   Other Relevant Orders   Ambulatory referral to [facility]    Other Visit Diagnoses    Vitamin D deficiency       Relevant Orders   Vitamin D 25 hydroxy (Completed)   Palpitation       Relevant Orders   TSH (Completed)   CBC no Diff (Completed)   Need for 23-polyvalent pneumococcal polysaccharide vaccine       Relevant Orders   Pneumococcal polysaccharide vaccine 23-valent greater than or equal to 2yo subcutaneous/IM (Completed)     Palpitations-we will rule out anemia Julia thyroid disorder.  We will make sure blood pressures well controlled.  If symptoms persist or become more  frequent or prolonged then please let us know Julia we will do further work-up.  Also consider could be related to recent increase in stress levels.  Meds ordered this encounter  Medications  . venlafaxine XR (EFFEXOR-XR) 150 MG 24 hr capsule    Sig: Take 1 capsule (150 mg total) by mouth daily.    Dispense:  90 capsule    Refill:  0    Follow-up: Return in about 2 months (around 07/01/2019) for Mood .    Beatrice Lecher, MD

## 2019-04-30 NOTE — Patient Instructions (Signed)
Monitor your blood pressure 2-3 times a week.  If they are running greater than 135 then please call me back.

## 2019-04-30 NOTE — Assessment & Plan Note (Signed)
Well controlled. Continue current regimen. Follow up in  4 mo 

## 2019-04-30 NOTE — Assessment & Plan Note (Signed)
BP borderline today.  She does take a chronic NSAID.  Encouraged her to monitor at home just to make sure it is not related to the palpitations she has been experiencing.

## 2019-05-01 ENCOUNTER — Encounter: Payer: Self-pay | Admitting: Family Medicine

## 2019-05-01 LAB — LIPID PANEL
Cholesterol: 213 mg/dL — ABNORMAL HIGH (ref <200)
HDL: 74 mg/dL (ref >=50)
LDL Cholesterol (Calc): 117 mg/dL (calc) — ABNORMAL HIGH
Non-HDL Cholesterol (Calc): 139 mg/dL (calc) — ABNORMAL HIGH (ref <130)
Total CHOL/HDL Ratio: 2.9 (calc) (ref <5.0)
Triglycerides: 108 mg/dL (ref <150)

## 2019-05-01 LAB — COMPLETE METABOLIC PANEL WITH GFR
AG Ratio: 1.5 (calc) (ref 1.0–2.5)
ALT: 24 U/L (ref 6–29)
AST: 22 U/L (ref 10–35)
Albumin: 4.3 g/dL (ref 3.6–5.1)
Alkaline phosphatase (APISO): 86 U/L (ref 37–153)
BUN: 23 mg/dL (ref 7–25)
CO2: 28 mmol/L (ref 20–32)
Calcium: 10.1 mg/dL (ref 8.6–10.4)
Chloride: 101 mmol/L (ref 98–110)
Creat: 0.89 mg/dL (ref 0.50–0.99)
GFR, Est African American: 79 mL/min/{1.73_m2} (ref >=60)
GFR, Est Non African American: 68 mL/min/{1.73_m2} (ref >=60)
Globulin: 2.8 g/dL (calc) (ref 1.9–3.7)
Glucose, Bld: 123 mg/dL — ABNORMAL HIGH (ref 65–99)
Potassium: 3.5 mmol/L (ref 3.5–5.3)
Sodium: 140 mmol/L (ref 135–146)
Total Bilirubin: 0.9 mg/dL (ref 0.2–1.2)
Total Protein: 7.1 g/dL (ref 6.1–8.1)

## 2019-05-01 LAB — CBC
HCT: 45.7 % — ABNORMAL HIGH (ref 35.0–45.0)
Hemoglobin: 15.5 g/dL (ref 11.7–15.5)
MCH: 30.2 pg (ref 27.0–33.0)
MCHC: 33.9 g/dL (ref 32.0–36.0)
MCV: 89.1 fL (ref 80.0–100.0)
MPV: 10.9 fL (ref 7.5–12.5)
Platelets: 284 10*3/uL (ref 140–400)
RBC: 5.13 10*6/uL — ABNORMAL HIGH (ref 3.80–5.10)
RDW: 13.1 % (ref 11.0–15.0)
WBC: 7.3 10*3/uL (ref 3.8–10.8)

## 2019-05-01 LAB — TSH: TSH: 2.23 mIU/L (ref 0.40–4.50)

## 2019-05-01 LAB — VITAMIN D 25 HYDROXY (VIT D DEFICIENCY, FRACTURES): Vit D, 25-Hydroxy: 29 ng/mL — ABNORMAL LOW (ref 30–100)

## 2019-05-01 NOTE — Assessment & Plan Note (Signed)
Patient really struggling with mood.  We discussed options.  Will increase Effexor to 150 mg and also discussed referral to therapist.  She agreed so referral was placed.  Follow-up in 2 to 3 months.

## 2019-05-02 ENCOUNTER — Encounter: Payer: Self-pay | Admitting: Family Medicine

## 2019-05-02 MED ORDER — ATORVASTATIN CALCIUM 40 MG PO TABS: 40.0000 mg | ORAL_TABLET | Freq: Every day | ORAL | 3 refills | Status: DC

## 2019-06-03 ENCOUNTER — Ambulatory Visit (INDEPENDENT_AMBULATORY_CARE_PROVIDER_SITE_OTHER): Payer: Medicare Other | Admitting: Psychology

## 2019-06-03 DIAGNOSIS — F4323 Adjustment disorder with mixed anxiety and depressed mood: Secondary | ICD-10-CM | POA: Diagnosis not present

## 2019-06-17 ENCOUNTER — Ambulatory Visit (INDEPENDENT_AMBULATORY_CARE_PROVIDER_SITE_OTHER): Payer: Medicare Other | Admitting: Psychology

## 2019-06-17 DIAGNOSIS — F4323 Adjustment disorder with mixed anxiety and depressed mood: Secondary | ICD-10-CM

## 2019-06-19 ENCOUNTER — Encounter: Payer: Self-pay | Admitting: Family Medicine

## 2019-06-21 ENCOUNTER — Encounter: Payer: Self-pay | Admitting: Family Medicine

## 2019-06-21 NOTE — Telephone Encounter (Signed)
Since you recommended once a day I have pended Metoprolol ER 25 mg, please review and send if OK

## 2019-06-21 NOTE — Addendum Note (Signed)
Addended by: Jed Limerick on: 06/21/2019 03:24 PM   Modules accepted: Orders

## 2019-06-21 NOTE — Telephone Encounter (Signed)
Per lab results, switching from Simvastatin to Atorvastatin.  Simvastatin 40 removed from med list   There is already an Atorvastatin on her med list from 05/02/19, please advise

## 2019-06-22 MED ORDER — METOPROLOL SUCCINATE ER 25 MG PO TB24: 25.0000 mg | ORAL_TABLET | Freq: Every day | ORAL | 0 refills | Status: DC

## 2019-07-01 ENCOUNTER — Other Ambulatory Visit: Payer: Self-pay | Admitting: Family Medicine

## 2019-07-01 ENCOUNTER — Ambulatory Visit (INDEPENDENT_AMBULATORY_CARE_PROVIDER_SITE_OTHER): Payer: Medicare Other | Admitting: Psychology

## 2019-07-01 DIAGNOSIS — F4323 Adjustment disorder with mixed anxiety and depressed mood: Secondary | ICD-10-CM

## 2019-07-01 DIAGNOSIS — F339 Major depressive disorder, recurrent, unspecified: Secondary | ICD-10-CM

## 2019-07-02 ENCOUNTER — Other Ambulatory Visit: Payer: Self-pay

## 2019-07-02 ENCOUNTER — Encounter: Payer: Self-pay | Admitting: Family Medicine

## 2019-07-02 ENCOUNTER — Ambulatory Visit (INDEPENDENT_AMBULATORY_CARE_PROVIDER_SITE_OTHER): Payer: Medicare Other | Admitting: Family Medicine

## 2019-07-02 VITALS — BP 124/59 | HR 78 | Ht 64.0 in | Wt 260.0 lb

## 2019-07-02 DIAGNOSIS — F330 Major depressive disorder, recurrent, mild: Secondary | ICD-10-CM | POA: Diagnosis not present

## 2019-07-02 DIAGNOSIS — I10 Essential (primary) hypertension: Secondary | ICD-10-CM

## 2019-07-02 DIAGNOSIS — Z6841 Body Mass Index (BMI) 40.0 and over, adult: Secondary | ICD-10-CM | POA: Diagnosis not present

## 2019-07-02 NOTE — Patient Instructions (Signed)
DASH Eating Plan DASH stands for "Dietary Approaches to Stop Hypertension." The DASH eating plan is a healthy eating plan that has been shown to reduce high blood pressure (hypertension). It may also reduce your risk for type 2 diabetes, heart disease, and stroke. The DASH eating plan may also help with weight loss. What are tips for following this plan?  General guidelines  Avoid eating more than 2,300 mg (milligrams) of salt (sodium) a day. If you have hypertension, you may need to reduce your sodium intake to 1,500 mg a day.  Limit alcohol intake to no more than 1 drink a day for nonpregnant women and 2 drinks a day for men. One drink equals 12 oz of beer, 5 oz of wine, or 1 oz of hard liquor.  Work with your health care provider to maintain a healthy body weight or to lose weight. Ask what an ideal weight is for you.  Get at least 30 minutes of exercise that causes your heart to beat faster (aerobic exercise) most days of the week. Activities may include walking, swimming, or biking.  Work with your health care provider or diet and nutrition specialist (dietitian) to adjust your eating plan to your individual calorie needs. Reading food labels   Check food labels for the amount of sodium per serving. Choose foods with less than 5 percent of the Daily Value of sodium. Generally, foods with less than 300 mg of sodium per serving fit into this eating plan.  To find whole grains, look for the word "whole" as the first word in the ingredient list. Shopping  Buy products labeled as "low-sodium" or "no salt added."  Buy fresh foods. Avoid canned foods and premade or frozen meals. Cooking  Avoid adding salt when cooking. Use salt-free seasonings or herbs instead of table salt or sea salt. Check with your health care provider or pharmacist before using salt substitutes.  Do not fry foods. Cook foods using healthy methods such as baking, boiling, grilling, and broiling instead.  Cook with  heart-healthy oils, such as olive, canola, soybean, or sunflower oil. Meal planning  Eat a balanced diet that includes: ? 5 or more servings of fruits and vegetables each day. At each meal, try to fill half of your plate with fruits and vegetables. ? Up to 6-8 servings of whole grains each day. ? Less than 6 oz of lean meat, poultry, or fish each day. A 3-oz serving of meat is about the same size as a deck of cards. One egg equals 1 oz. ? 2 servings of low-fat dairy each day. ? A serving of nuts, seeds, or beans 5 times each week. ? Heart-healthy fats. Healthy fats called Omega-3 fatty acids are found in foods such as flaxseeds and coldwater fish, like sardines, salmon, and mackerel.  Limit how much you eat of the following: ? Canned or prepackaged foods. ? Food that is high in trans fat, such as fried foods. ? Food that is high in saturated fat, such as fatty meat. ? Sweets, desserts, sugary drinks, and other foods with added sugar. ? Full-fat dairy products.  Do not salt foods before eating.  Try to eat at least 2 vegetarian meals each week.  Eat more home-cooked food and less restaurant, buffet, and fast food.  When eating at a restaurant, ask that your food be prepared with less salt or no salt, if possible. What foods are recommended? The items listed may not be a complete list. Talk with your dietitian about   what dietary choices are best for you. Grains Whole-grain or whole-wheat bread. Whole-grain or whole-wheat pasta. Brown rice. Oatmeal. Quinoa. Bulgur. Whole-grain and low-sodium cereals. Pita bread. Low-fat, low-sodium crackers. Whole-wheat flour tortillas. Vegetables Fresh or frozen vegetables (raw, steamed, roasted, or grilled). Low-sodium or reduced-sodium tomato and vegetable juice. Low-sodium or reduced-sodium tomato sauce and tomato paste. Low-sodium or reduced-sodium canned vegetables. Fruits All fresh, dried, or frozen fruit. Canned fruit in natural juice (without  added sugar). Meat and other protein foods Skinless chicken or turkey. Ground chicken or turkey. Pork with fat trimmed off. Fish and seafood. Egg whites. Dried beans, peas, or lentils. Unsalted nuts, nut butters, and seeds. Unsalted canned beans. Lean cuts of beef with fat trimmed off. Low-sodium, lean deli meat. Dairy Low-fat (1%) or fat-free (skim) milk. Fat-free, low-fat, or reduced-fat cheeses. Nonfat, low-sodium ricotta or cottage cheese. Low-fat or nonfat yogurt. Low-fat, low-sodium cheese. Fats and oils Soft margarine without trans fats. Vegetable oil. Low-fat, reduced-fat, or light mayonnaise and salad dressings (reduced-sodium). Canola, safflower, olive, soybean, and sunflower oils. Avocado. Seasoning and other foods Herbs. Spices. Seasoning mixes without salt. Unsalted popcorn and pretzels. Fat-free sweets. What foods are not recommended? The items listed may not be a complete list. Talk with your dietitian about what dietary choices are best for you. Grains Baked goods made with fat, such as croissants, muffins, or some breads. Dry pasta or rice meal packs. Vegetables Creamed or fried vegetables. Vegetables in a cheese sauce. Regular canned vegetables (not low-sodium or reduced-sodium). Regular canned tomato sauce and paste (not low-sodium or reduced-sodium). Regular tomato and vegetable juice (not low-sodium or reduced-sodium). Pickles. Olives. Fruits Canned fruit in a light or heavy syrup. Fried fruit. Fruit in cream or butter sauce. Meat and other protein foods Fatty cuts of meat. Ribs. Fried meat. Bacon. Sausage. Bologna and other processed lunch meats. Salami. Fatback. Hotdogs. Bratwurst. Salted nuts and seeds. Canned beans with added salt. Canned or smoked fish. Whole eggs or egg yolks. Chicken or turkey with skin. Dairy Whole or 2% milk, cream, and half-and-half. Whole or full-fat cream cheese. Whole-fat or sweetened yogurt. Full-fat cheese. Nondairy creamers. Whipped toppings.  Processed cheese and cheese spreads. Fats and oils Butter. Stick margarine. Lard. Shortening. Ghee. Bacon fat. Tropical oils, such as coconut, palm kernel, or palm oil. Seasoning and other foods Salted popcorn and pretzels. Onion salt, garlic salt, seasoned salt, table salt, and sea salt. Worcestershire sauce. Tartar sauce. Barbecue sauce. Teriyaki sauce. Soy sauce, including reduced-sodium. Steak sauce. Canned and packaged gravies. Fish sauce. Oyster sauce. Cocktail sauce. Horseradish that you find on the shelf. Ketchup. Mustard. Meat flavorings and tenderizers. Bouillon cubes. Hot sauce and Tabasco sauce. Premade or packaged marinades. Premade or packaged taco seasonings. Relishes. Regular salad dressings. Where to find more information:  National Heart, Lung, and Blood Institute: www.nhlbi.nih.gov  American Heart Association: www.heart.org Summary  The DASH eating plan is a healthy eating plan that has been shown to reduce high blood pressure (hypertension). It may also reduce your risk for type 2 diabetes, heart disease, and stroke.  With the DASH eating plan, you should limit salt (sodium) intake to 2,300 mg a day. If you have hypertension, you may need to reduce your sodium intake to 1,500 mg a day.  When on the DASH eating plan, aim to eat more fresh fruits and vegetables, whole grains, lean proteins, low-fat dairy, and heart-healthy fats.  Work with your health care provider or diet and nutrition specialist (dietitian) to adjust your eating plan to your   individual calorie needs. This information is not intended to replace advice given to you by your health care provider. Make sure you discuss any questions you have with your health care provider. Document Revised: 04/14/2017 Document Reviewed: 04/25/2016 Elsevier Patient Education  2020 Elsevier Inc.  Low-Sodium Eating Plan Sodium, which is an element that makes up salt, helps you maintain a healthy balance of fluids in your body.  Too much sodium can increase your blood pressure and cause fluid and waste to be held in your body. Your health care provider or dietitian may recommend following this plan if you have high blood pressure (hypertension), kidney disease, liver disease, or heart failure. Eating less sodium can help lower your blood pressure, reduce swelling, and protect your heart, liver, and kidneys. What are tips for following this plan? General guidelines  Most people on this plan should limit their sodium intake to 1,500-2,000 mg (milligrams) of sodium each day. Reading food labels   The Nutrition Facts label lists the amount of sodium in one serving of the food. If you eat more than one serving, you must multiply the listed amount of sodium by the number of servings.  Choose foods with less than 140 mg of sodium per serving.  Avoid foods with 300 mg of sodium or more per serving. Shopping  Look for lower-sodium products, often labeled as "low-sodium" or "no salt added."  Always check the sodium content even if foods are labeled as "unsalted" or "no salt added".  Buy fresh foods. ? Avoid canned foods and premade or frozen meals. ? Avoid canned, cured, or processed meats  Buy breads that have less than 80 mg of sodium per slice. Cooking  Eat more home-cooked food and less restaurant, buffet, and fast food.  Avoid adding salt when cooking. Use salt-free seasonings or herbs instead of table salt or sea salt. Check with your health care provider or pharmacist before using salt substitutes.  Cook with plant-based oils, such as canola, sunflower, or olive oil. Meal planning  When eating at a restaurant, ask that your food be prepared with less salt or no salt, if possible.  Avoid foods that contain MSG (monosodium glutamate). MSG is sometimes added to Chinese food, bouillon, and some canned foods. What foods are recommended? The items listed may not be a complete list. Talk with your dietitian about  what dietary choices are best for you. Grains Low-sodium cereals, including oats, puffed wheat and rice, and shredded wheat. Low-sodium crackers. Unsalted rice. Unsalted pasta. Low-sodium bread. Whole-grain breads and whole-grain pasta. Vegetables Fresh or frozen vegetables. "No salt added" canned vegetables. "No salt added" tomato sauce and paste. Low-sodium or reduced-sodium tomato and vegetable juice. Fruits Fresh, frozen, or canned fruit. Fruit juice. Meats and other protein foods Fresh or frozen (no salt added) meat, poultry, seafood, and fish. Low-sodium canned tuna and salmon. Unsalted nuts. Dried peas, beans, and lentils without added salt. Unsalted canned beans. Eggs. Unsalted nut butters. Dairy Milk. Soy milk. Cheese that is naturally low in sodium, such as ricotta cheese, fresh mozzarella, or Swiss cheese Low-sodium or reduced-sodium cheese. Cream cheese. Yogurt. Fats and oils Unsalted butter. Unsalted margarine with no trans fat. Vegetable oils such as canola or olive oils. Seasonings and other foods Fresh and dried herbs and spices. Salt-free seasonings. Low-sodium mustard and ketchup. Sodium-free salad dressing. Sodium-free light mayonnaise. Fresh or refrigerated horseradish. Lemon juice. Vinegar. Homemade, reduced-sodium, or low-sodium soups. Unsalted popcorn and pretzels. Low-salt or salt-free chips. What foods are not recommended? The   items listed may not be a complete list. Talk with your dietitian about what dietary choices are best for you. Grains Instant hot cereals. Bread stuffing, pancake, and biscuit mixes. Croutons. Seasoned rice or pasta mixes. Noodle soup cups. Boxed or frozen macaroni and cheese. Regular salted crackers. Self-rising flour. Vegetables Sauerkraut, pickled vegetables, and relishes. Olives. French fries. Onion rings. Regular canned vegetables (not low-sodium or reduced-sodium). Regular canned tomato sauce and paste (not low-sodium or reduced-sodium).  Regular tomato and vegetable juice (not low-sodium or reduced-sodium). Frozen vegetables in sauces. Meats and other protein foods Meat or fish that is salted, canned, smoked, spiced, or pickled. Bacon, ham, sausage, hotdogs, corned beef, chipped beef, packaged lunch meats, salt pork, jerky, pickled herring, anchovies, regular canned tuna, sardines, salted nuts. Dairy Processed cheese and cheese spreads. Cheese curds. Blue cheese. Feta cheese. String cheese. Regular cottage cheese. Buttermilk. Canned milk. Fats and oils Salted butter. Regular margarine. Ghee. Bacon fat. Seasonings and other foods Onion salt, garlic salt, seasoned salt, table salt, and sea salt. Canned and packaged gravies. Worcestershire sauce. Tartar sauce. Barbecue sauce. Teriyaki sauce. Soy sauce, including reduced-sodium. Steak sauce. Fish sauce. Oyster sauce. Cocktail sauce. Horseradish that you find on the shelf. Regular ketchup and mustard. Meat flavorings and tenderizers. Bouillon cubes. Hot sauce and Tabasco sauce. Premade or packaged marinades. Premade or packaged taco seasonings. Relishes. Regular salad dressings. Salsa. Potato and tortilla chips. Corn chips and puffs. Salted popcorn and pretzels. Canned or dried soups. Pizza. Frozen entrees and pot pies. Summary  Eating less sodium can help lower your blood pressure, reduce swelling, and protect your heart, liver, and kidneys.  Most people on this plan should limit their sodium intake to 1,500-2,000 mg (milligrams) of sodium each day.  Canned, boxed, and frozen foods are high in sodium. Restaurant foods, fast foods, and pizza are also very high in sodium. You also get sodium by adding salt to food.  Try to cook at home, eat more fresh fruits and vegetables, and eat less fast food, canned, processed, or prepared foods. This information is not intended to replace advice given to you by your health care provider. Make sure you discuss any questions you have with your health  care provider. Document Revised: 04/14/2017 Document Reviewed: 04/25/2016 Elsevier Patient Education  2020 Elsevier Inc.  

## 2019-07-02 NOTE — Assessment & Plan Note (Signed)
Her weight has been gradually going up over the last year in fact she is about 8 pounds up from 2019.  I think she would be a great candidate for bariatric program before knee replacement to try to get her BMI down to less than 40.  So that she will have better outcomes status post knee replacement.  Going in place referral today.  Discussed how most of those programs work.  She would like to stay here locally so we will get her referred to [facility].

## 2019-07-02 NOTE — Assessment & Plan Note (Signed)
Well controlled. Continue current regimen. Follow up in  6 mo  

## 2019-07-02 NOTE — Assessment & Plan Note (Signed)
Does like the increase in the Effexor has been effective so we will continue with 150 mg for now and then plan to follow-up again in about 6 months.

## 2019-07-02 NOTE — Progress Notes (Signed)
Established Patient Office Visit  Subjective:  Patient ID: Julia Woods, female    DOB: 09-09-1955  Age: 64 y.o. MRN: [id number]  CC:  Chief Complaint  Patient presents with  . Hypertension  . mood    HPI Julia Woods presents for   Follow-up hypertension-she had sent [place] a my chart note that some of her blood pressures were elevated.  We decided to add metoprolol 25 mg to her regimen.  Follow-up depression-I last saw her she was feeling more down and sad.  She was wanting to sleep more.  She had any grandbaby who she was unable to see because of Covid and that was causing some increased stress.  We increased her Effexor at that time 150 mg and made a referral to therapy/counselor.  She does feel like the increase in medication has been helpful and feels like it has had taken the edge off of things.   PHQ9 SCORE ONLY 07/02/2019 04/30/2019 10/29/2018  Score 7 8 1     She is interested in a bariatric program here locally.  She really needs to get some weight off to have her knee replacement surgery currently her BMI is 44.   Past Medical History:  Diagnosis Date  . Allergy    allergic rhinitis  . Hyperlipidemia   . Hypertension   . Lumbar herniated disc 06/2012   injection, [facility]  . Menopausal state   . Mental disorder    depression  . S/P breast biopsy 1990, 12-2007   benign/Dr. 07-12-2001 at [facility]  . Sleep apnea     Past Surgical History:  Procedure Laterality Date  . ANKLE FRACTURE SURGERY  10-2004   does have hardware/RT  . BREAST BIOPSY  1990   8/09 benign/Dr 10/09 @ [facility]  . BREAST EXCISIONAL BIOPSY Left   . FOOT FRACTURE SURGERY  11-2001   RT  . KNEE ARTHROSCOPY  07-2008   RT/Dr. 08-2008  . REPLACEMENT TOTAL KNEE  03-2010   RT/Dr. 04-2010  . STEROID INJECTION TO SCAR     lumbar spine    Family History  Problem Relation Age of Onset  . Cancer Mother 39       colon  . Diabetes Mother   . Stroke Mother   . Heart attack Mother   . Hyperlipidemia Mother    . Hypertension Mother   . Heart attack Father 73  . Hyperlipidemia Other        family history  . Hypertension Other        family history  . Stroke Other        1st degree relative < 50    Social History   Socioeconomic History  . Marital status: Married    Spouse name: Not on file  . Number of children: Not on file  . Years of education: Not on file  . Highest education level: Not on file  Occupational History  . Not on file  Tobacco Use  . Smoking status: Never Smoker  . Smokeless tobacco: Never Used  Substance and Sexual Activity  . Alcohol use: No  . Drug use: No  . 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  Other Topics Concern  . Not on file  Social History Narrative  . Not on file   Social Determinants of Health   Financial Resource Strain:   . Difficulty of Paying Living Expenses: Not  on file  Food Insecurity:   . Worried About Programme researcher, broadcasting/film/video in the Last Year: Not on file  . Ran Out of Food in the Last Year: Not on file  Transportation Needs:   . Lack of Transportation (Medical): Not on file  . Lack of Transportation (Non-Medical): Not on file  Physical Activity:   . Days of Exercise per Week: Not on file  . Minutes of Exercise per Session: Not on file  Stress:   . Feeling of Stress : Not on file  Social Connections:   . Frequency of Communication with Friends and Family: Not on file  . Frequency of Social Gatherings with Friends and Family: Not on file  . Attends Religious Services: Not on file  . Active Member of Clubs or Organizations: Not on file  . Attends Banker Meetings: Not on file  . Marital Status: Not on file  Intimate Partner Violence:   . Fear of Current or Ex-Partner: Not on file  . Emotionally Abused: Not on file  . Physically Abused: Not on file  . Sexually Abused: Not on file    Outpatient  Medications Prior to Visit  Medication Sig Dispense Refill  . AMBULATORY NON FORMULARY MEDICATION Medication Name:  CPAP machine including tubing, filter, humidifier, mask. See sleep study included w/order  Dx OSA - G47.33  FAX:[phone] 1 Units 0  . atorvastatin (LIPITOR) 40 MG tablet Take 1 tablet (40 mg total) by mouth at bedtime. 90 tablet 3  . Cholecalciferol (VITAMIN D) 2000 units CAPS Take 1 capsule by mouth daily.    Marland Kitchen losartan-hydrochlorothiazide (HYZAAR) 100-25 MG tablet Take 1 tablet by mouth daily. 90 tablet 4  . meloxicam (MOBIC) 7.5 MG tablet Take 1 tablet (7.5 mg total) by mouth daily. 90 tablet 4  . metoprolol succinate (TOPROL-XL) 25 MG 24 hr tablet Take 1 tablet (25 mg total) by mouth daily. 90 tablet 0  . Multiple Vitamin (MULTIVITAMIN) tablet Take 1 tablet by mouth daily.      . valACYclovir (VALTREX) 1000 MG tablet Take 1 tablet (1,000 mg total) by mouth 2 (two) times daily as needed. 180 tablet 4  . venlafaxine XR (EFFEXOR-XR) 150 MG 24 hr capsule TAKE 1 CAPSULE BY MOUTH  DAILY 90 capsule 3  . XIIDRA 5 % SOLN Place into both eyes.     No facility-administered medications prior to visit.    Allergies  Allergen Reactions  . Penicillins Hives    ROS Review of Systems    Objective:    Physical Exam  Constitutional: She is oriented to person, place, and time. She appears well-developed and well-nourished.  HENT:  Head: Normocephalic and atraumatic.  Cardiovascular: Normal rate, regular rhythm and normal heart sounds.  Pulmonary/Chest: Effort normal and breath sounds normal.  Neurological: She is alert and oriented to person, place, and time.  Skin: Skin is warm and dry.  Psychiatric: She has a normal mood and affect. Her behavior is normal.    BP (!) 124/59   Pulse 78   Ht 5\' 4"  (1.626 m)   Wt 260 lb (117.9 kg)   LMP 11/13/2008   SpO2 94%   BMI 44.63 kg/m  Wt Readings from Last 3 Encounters:  07/02/19 260 lb (117.9 kg)  04/30/19 260 lb (117.9 kg)   10/29/18 253 lb (114.8 kg)     Health Maintenance Due  Topic Date Due  . OPHTHALMOLOGY EXAM  08/15/2018  . FOOT EXAM  09/05/2018    There  are no preventive care reminders to display for this patient.     Assessment & Plan:   Problem List Items Addressed This Visit      Cardiovascular and Mediastinum   HYPERTENSION, BENIGN    Well controlled. Continue current regimen. Follow up in  6 mo        Other   Obesity, Class III, BMI 40-49.9 (morbid obesity) ([place])    Her weight has been gradually going up over the last year in fact she is about 8 pounds up from 2019.  I think she would be a great candidate for bariatric program before knee replacement to try to get her BMI down to less than 40.  So that she will have better outcomes status post knee replacement.  Going in place referral today.  Discussed how most of those programs work.  She would like to stay here locally so we will get her referred to [facility].      Depression, major, recurrent ([place]) - Primary    Does like the increase in the Effexor has been effective so we will continue with 150 mg for now and then plan to follow-up again in about 6 months.       Other Visit Diagnoses    BMI 40.0-44.9, adult ([place])       Relevant Orders   Amb Ref to Medical Weight Management      No orders of the defined types were placed in this encounter.   Follow-up: Return in about 3 months (around 09/29/2019) for Hypertension.    Beatrice Lecher, MD

## 2019-07-17 ENCOUNTER — Ambulatory Visit: Payer: Medicare Other | Admitting: Psychology

## 2019-08-23 ENCOUNTER — Other Ambulatory Visit: Payer: Self-pay | Admitting: Family Medicine

## 2019-08-30 ENCOUNTER — Other Ambulatory Visit: Payer: Self-pay | Admitting: Family Medicine

## 2019-08-30 DIAGNOSIS — Z1231 Encounter for screening mammogram for malignant neoplasm of breast: Secondary | ICD-10-CM

## 2019-09-30 ENCOUNTER — Ambulatory Visit: Payer: Medicare Other | Admitting: Family Medicine

## 2019-10-08 ENCOUNTER — Encounter: Payer: Self-pay | Admitting: Family Medicine

## 2019-10-08 ENCOUNTER — Ambulatory Visit (INDEPENDENT_AMBULATORY_CARE_PROVIDER_SITE_OTHER): Payer: Medicare Other | Admitting: Family Medicine

## 2019-10-08 ENCOUNTER — Other Ambulatory Visit: Payer: Self-pay

## 2019-10-08 VITALS — BP 110/64 | HR 83 | Ht 64.0 in | Wt 232.0 lb

## 2019-10-08 DIAGNOSIS — E119 Type 2 diabetes mellitus without complications: Secondary | ICD-10-CM | POA: Diagnosis not present

## 2019-10-08 DIAGNOSIS — I10 Essential (primary) hypertension: Secondary | ICD-10-CM | POA: Diagnosis not present

## 2019-10-08 DIAGNOSIS — Z6839 Body mass index (BMI) 39.0-39.9, adult: Secondary | ICD-10-CM | POA: Diagnosis not present

## 2019-10-08 LAB — POCT GLYCOSYLATED HEMOGLOBIN (HGB A1C): Hemoglobin A1C: 5.5 % (ref 4.0–5.6)

## 2019-10-08 NOTE — Patient Instructions (Signed)
If your blood pressures are dropping below 110 on the top number then I would recommend stopping the metoprolol that we added back in December.  If your blood pressures are staying between 110 and 120 consistently then okay to cut the metoprolol in half.  And take a half a tab daily.

## 2019-10-08 NOTE — Progress Notes (Signed)
Established Patient Office Visit  Subjective:  Patient ID: Julia Woods, female    DOB: Mar 10, 1956  Age: 64 y.o. MRN: [id number]  CC:  Chief Complaint  Patient presents with  . Hypertension    HPI Julia Woods presents for   Hypertension- Pt denies chest pain, SOB, dizziness, or heart palpitations.  Taking meds as directed w/o problems.  Denies medication side effects.    Obesity/BMI 58 -she has done fantastic and lost about 28 pounds since I saw her in February.  Is hoping to get scheduled for knee surgery, for knee replacement.  She just has to get her BMI under 40.  And she has been working at it diligently..    Past Medical History:  Diagnosis Date  . Allergy    allergic rhinitis  . Hyperlipidemia   . Hypertension   . Lumbar herniated disc 06/2012   injection, [facility]  . Menopausal state   . Mental disorder    depression  . S/P breast biopsy 1990, 12-2007   benign/Dr. Donne Hazel at [facility]  . Sleep apnea     Past Surgical History:  Procedure Laterality Date  . ANKLE FRACTURE SURGERY  10-2004   does have hardware/RT  . BREAST BIOPSY  1990   8/09 benign/Dr Donne Hazel @ [facility]  . BREAST EXCISIONAL BIOPSY Left   . FOOT FRACTURE SURGERY  11-2001   RT  . KNEE ARTHROSCOPY  07-2008   RT/Dr. Maureen Ralphs  . REPLACEMENT TOTAL KNEE  03-2010   RT/Dr. Maureen Ralphs  . STEROID INJECTION TO SCAR     lumbar spine    Family History  Problem Relation Age of Onset  . Cancer Mother 37       colon  . Diabetes Mother   . Stroke Mother   . Heart attack Mother   . Hyperlipidemia Mother   . Hypertension Mother   . Heart attack Father 57  . Hyperlipidemia Other        family history  . Hypertension Other        family history  . Stroke Other        1st degree relative < 50    Social History   Socioeconomic History  . Marital status: Married    Spouse name: Not on file  . Number of children: Not on file  . Years of education: Not on file  . Highest education level: Not on file   Occupational History  . Not on file  Tobacco Use  . Smoking status: Never Smoker  . Smokeless tobacco: Never Used  Substance and Sexual Activity  . Alcohol use: No  . Drug use: No  . 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  Other Topics Concern  . Not on file  Social History Narrative  . Not on file   Social Determinants of Health   Financial Resource Strain:   . Difficulty of Paying Living Expenses:   Food Insecurity:   . Worried About Charity fundraiser in the Last Year:   . Arboriculturist in the Last Year:   Transportation Needs:   . Film/video editor (Medical):   Marland Kitchen Lack of Transportation (Non-Medical):   Physical Activity:   . Days of Exercise per Week:   . Minutes of Exercise per Session:   Stress:   . Feeling of Stress :   Social Connections:   . Frequency  of Communication with Friends and Family:   . Frequency of Social Gatherings with Friends and Family:   . Attends Religious Services:   . Active Member of Clubs or Organizations:   . Attends Banker Meetings:   Marland Kitchen Marital Status:   Intimate Partner Violence:   . Fear of Current or Ex-Partner:   . Emotionally Abused:   Marland Kitchen Physically Abused:   . Sexually Abused:     Outpatient Medications Prior to Visit  Medication Sig Dispense Refill  . AMBULATORY NON FORMULARY MEDICATION Medication Name:  CPAP machine including tubing, filter, humidifier, mask. See sleep study included w/order  Dx OSA - G47.33  [phone] 1 Units 0  . atorvastatin (LIPITOR) 40 MG tablet Take 1 tablet (40 mg total) by mouth at bedtime. 90 tablet 3  . Cholecalciferol (VITAMIN D) 2000 units CAPS Take 1 capsule by mouth daily.    Marland Kitchen losartan-hydrochlorothiazide (HYZAAR) 100-25 MG tablet Take 1 tablet by mouth daily. 90 tablet 4  . meloxicam (MOBIC) 7.5 MG tablet Take 1 tablet (7.5 mg total) by mouth  daily. 90 tablet 4  . metoprolol succinate (TOPROL-XL) 25 MG 24 hr tablet TAKE 1 TABLET BY MOUTH  DAILY 90 tablet 1  . Multiple Vitamin (MULTIVITAMIN) tablet Take 1 tablet by mouth daily.      . valACYclovir (VALTREX) 1000 MG tablet Take 1 tablet (1,000 mg total) by mouth 2 (two) times daily as needed. 180 tablet 4  . venlafaxine XR (EFFEXOR-XR) 150 MG 24 hr capsule TAKE 1 CAPSULE BY MOUTH  DAILY 90 capsule 3  . XIIDRA 5 % SOLN Place into both eyes.     No facility-administered medications prior to visit.    Allergies  Allergen Reactions  . Penicillins Hives    ROS Review of Systems    Objective:    Physical Exam  Constitutional: She is oriented to person, place, and time. She appears well-developed and well-nourished.  HENT:  Head: Normocephalic and atraumatic.  Cardiovascular: Normal rate, regular rhythm and normal heart sounds.  Pulmonary/Chest: Effort normal and breath sounds normal.  Neurological: She is alert and oriented to person, place, and time.  Skin: Skin is warm and dry.  Psychiatric: She has a normal mood and affect. Her behavior is normal.    BP 110/64   Pulse 83   Ht 5\' 4"  (1.626 m)   Wt 232 lb (105.2 kg)   LMP 11/13/2008   SpO2 95%   BMI 39.82 kg/m  Wt Readings from Last 3 Encounters:  10/08/19 232 lb (105.2 kg)  07/02/19 260 lb (117.9 kg)  04/30/19 260 lb (117.9 kg)     Health Maintenance Due  Topic Date Due  . OPHTHALMOLOGY EXAM  08/15/2018  . FOOT EXAM  09/05/2018    There are no preventive care reminders to display for this patient.  Lab Results  Component Value Date   TSH 2.23 04/30/2019   Lab Results  Component Value Date   WBC 7.3 04/30/2019   HGB 15.5 04/30/2019   HCT 45.7 (H) 04/30/2019   MCV 89.1 04/30/2019   PLT 284 04/30/2019   Lab Results  Component Value Date   NA 140 04/30/2019   K 3.5 04/30/2019   CO2 28 04/30/2019   GLUCOSE 123 (H) 04/30/2019   BUN 23 04/30/2019   CREATININE 0.89 04/30/2019   BILITOT 0.9  04/30/2019   ALKPHOS 87 11/22/2016   AST 22 04/30/2019   ALT 24 04/30/2019   PROT 7.1 04/30/2019  ALBUMIN 4.3 11/22/2016   CALCIUM 10.1 04/30/2019   Lab Results  Component Value Date   CHOL 213 (H) 04/30/2019   Lab Results  Component Value Date   HDL 74 04/30/2019   Lab Results  Component Value Date   LDLCALC 117 (H) 04/30/2019   Lab Results  Component Value Date   TRIG 108 04/30/2019   Lab Results  Component Value Date   CHOLHDL 2.9 04/30/2019   Lab Results  Component Value Date   HGBA1C 5.5 10/08/2019      Assessment & Plan:   Problem List Items Addressed This Visit      Cardiovascular and Mediastinum   HYPERTENSION, BENIGN - Primary    Pressure actually looks phenomenal.  Today.  Just discussed to really keep an eye on her blood pressures and if they are staying low then we may need to actually cut back on her medication especially with her recent significant weight loss and especially if she continues to work on losing weight which I think would be fantastic.      Relevant Orders   BASIC METABOLIC PANEL WITH GFR     Endocrine   Diet-controlled diabetes mellitus (HCC)    Last A1c looked absolutely phenomenal.  Due to recheck again.      Relevant Orders   POCT glycosylated hemoglobin (Hb A1C) (Completed)     Other   BMI 39.0-39.9,adult    Just encouraged her to continue to work on weight loss I would love to see her get under 200 pounds ultimately and she would benefit greatly if she is able to again work towards that goal before having knee replacement surgery.         No orders of the defined types were placed in this encounter.   Follow-up: Return in about 6 months (around 04/09/2020) for Hypertension.    Nani Gasser, MD

## 2019-10-08 NOTE — Assessment & Plan Note (Signed)
Just encouraged her to continue to work on weight loss I would love to see her get under 200 pounds ultimately and she would benefit greatly if she is able to again work towards that goal before having knee replacement surgery.

## 2019-10-08 NOTE — Assessment & Plan Note (Signed)
Pressure actually looks phenomenal.  Today.  Just discussed to really keep an eye on her blood pressures and if they are staying low then we may need to actually cut back on her medication especially with her recent significant weight loss and especially if she continues to work on losing weight which I think would be fantastic.

## 2019-10-08 NOTE — Assessment & Plan Note (Signed)
Last A1c looked absolutely phenomenal.  Due to recheck again.

## 2019-10-16 ENCOUNTER — Ambulatory Visit (INDEPENDENT_AMBULATORY_CARE_PROVIDER_SITE_OTHER): Payer: Medicare Other

## 2019-10-16 ENCOUNTER — Other Ambulatory Visit: Payer: Self-pay

## 2019-10-16 DIAGNOSIS — Z1231 Encounter for screening mammogram for malignant neoplasm of breast: Secondary | ICD-10-CM | POA: Diagnosis not present

## 2019-11-07 LAB — BASIC METABOLIC PANEL WITH GFR
BUN/Creatinine Ratio: 31 (calc) — ABNORMAL HIGH (ref 6–22)
BUN: 28 mg/dL — ABNORMAL HIGH (ref 7–25)
CO2: 29 mmol/L (ref 20–32)
Calcium: 10.3 mg/dL (ref 8.6–10.4)
Chloride: 100 mmol/L (ref 98–110)
Creat: 0.91 mg/dL (ref 0.50–0.99)
GFR, Est African American: 77 mL/min/{1.73_m2} (ref >=60)
GFR, Est Non African American: 66 mL/min/{1.73_m2} (ref >=60)
Glucose, Bld: 106 mg/dL — ABNORMAL HIGH (ref 65–99)
Potassium: 3.7 mmol/L (ref 3.5–5.3)
Sodium: 138 mmol/L (ref 135–146)

## 2019-11-08 NOTE — Progress Notes (Signed)
All labs are normal. 

## 2019-12-16 ENCOUNTER — Telehealth: Payer: Self-pay | Admitting: Family Medicine

## 2019-12-16 NOTE — Telephone Encounter (Signed)
Please call patient and let her know that she needs to schedule a preoperative clearance appointment.  We received notification from Dr. Clide Dales office that they are recommending left knee replacement.  Please schedule at her convenience.

## 2019-12-17 NOTE — Telephone Encounter (Signed)
Appointment has been made. No further questions.  °

## 2019-12-25 ENCOUNTER — Other Ambulatory Visit: Payer: Self-pay | Admitting: Family Medicine

## 2020-01-06 ENCOUNTER — Ambulatory Visit (INDEPENDENT_AMBULATORY_CARE_PROVIDER_SITE_OTHER): Payer: Medicare Other | Admitting: Family Medicine

## 2020-01-06 ENCOUNTER — Encounter: Payer: Self-pay | Admitting: Family Medicine

## 2020-01-06 VITALS — BP 123/55 | HR 80 | Ht 64.0 in | Wt 223.0 lb

## 2020-01-06 DIAGNOSIS — Z01818 Encounter for other preprocedural examination: Secondary | ICD-10-CM | POA: Diagnosis not present

## 2020-01-06 DIAGNOSIS — E119 Type 2 diabetes mellitus without complications: Secondary | ICD-10-CM | POA: Diagnosis not present

## 2020-01-06 LAB — POCT GLYCOSYLATED HEMOGLOBIN (HGB A1C): Hemoglobin A1C: 5.5 % (ref 4.0–5.6)

## 2020-01-06 NOTE — Assessment & Plan Note (Signed)
Well controlled. Continue current regimen. Follow up in  6 mo  

## 2020-01-06 NOTE — Progress Notes (Signed)
Established Patient Office Visit  Subjective:  Patient ID: Julia Woods, female    DOB: [birth year]  Age: 64 y.o. MRN: [id number]  CC:  Chief Complaint  Patient presents with  . Pre-op Exam    HPI   Julia Woods presents for preoperative clearance. She is scheduled for Left total knee arthroplasty in October.  She has had her right knee done with no complication. No prior hx of complication with anesthesia. No CP or SOB. Never smoked.  Can walking 2/10th of mile without difficulty.  NO history Cardiac disease though does have carotid stenosis. NO pulmonary disease. + Diabetes, diet controlled.   BMI is 38 A1C is 5.5% Anticoags: None  Past Medical History:  Diagnosis Date  . Allergy    allergic rhinitis  . Hyperlipidemia   . Hypertension   . Lumbar herniated disc 06/2012   injection, [facility]  . Menopausal state   . Mental disorder    depression  . S/P breast biopsy 1990, 12-2007   benign/Dr. Dwain Sarna at [facility]  . Sleep apnea     Past Surgical History:  Procedure Laterality Date  . ANKLE FRACTURE SURGERY  10-2004   does have hardware/RT  . BREAST BIOPSY  1990   8/09 benign/Dr Dwain Sarna @ [facility]  . BREAST EXCISIONAL BIOPSY Left   . FOOT FRACTURE SURGERY  11-2001   RT  . KNEE ARTHROSCOPY  07-2008   RT/Dr. Despina Hick  . REPLACEMENT TOTAL KNEE  03-2010   RT/Dr. Despina Hick  . STEROID INJECTION TO SCAR     lumbar spine    Family History  Problem Relation Age of Onset  . Cancer Mother 25       colon  . Diabetes Mother   . Stroke Mother   . Heart attack Mother   . Hyperlipidemia Mother   . Hypertension Mother   . Heart attack Father 56  . Hyperlipidemia Other        family history  . Hypertension Other        family history  . Stroke Other        1st degree relative < 50    Social History   Socioeconomic History  . Marital status: Married    Spouse name: Not on file  . Number of children: Not on file  . Years of education: Not on file  . Highest education level: Not  on file  Occupational History  . Not on file  Tobacco Use  . Smoking status: Never Smoker  . Smokeless tobacco: Never Used  Substance and Sexual Activity  . Alcohol use: No  . Drug use: No  . 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  Other Topics Concern  . Not on file  Social History Narrative  . Not on file   Social Determinants of Health   Financial Resource Strain:   . Difficulty of Paying Living Expenses: Not on file  Food Insecurity:   . Worried About Programme researcher, broadcasting/film/video in the Last Year: Not on file  . Ran Out of Food in the Last Year: Not on file  Transportation Needs:   . Lack of Transportation (Medical): Not on file  . Lack of Transportation (Non-Medical): Not on file  Physical Activity:   . Days of Exercise per Week: Not on file  . Minutes of Exercise per Session: Not on file  Stress:   . Feeling  of Stress : Not on file  Social Connections:   . Frequency of Communication with Friends and Family: Not on file  . Frequency of Social Gatherings with Friends and Family: Not on file  . Attends Religious Services: Not on file  . Active Member of Clubs or Organizations: Not on file  . Attends Banker Meetings: Not on file  . Marital Status: Not on file  Intimate Partner Violence:   . Fear of Current or Ex-Partner: Not on file  . Emotionally Abused: Not on file  . Physically Abused: Not on file  . Sexually Abused: Not on file    Outpatient Medications Prior to Visit  Medication Sig Dispense Refill  . AMBULATORY NON FORMULARY MEDICATION Medication Name:  CPAP machine including tubing, filter, humidifier, mask. See sleep study included w/order  Dx OSA - G47.33  FAX:[phone] 1 Units 0  . atorvastatin (LIPITOR) 40 MG tablet Take 1 tablet (40 mg total) by mouth at bedtime. 90 tablet 3  . Cholecalciferol (VITAMIN D) 2000 units CAPS  Take 1 capsule by mouth daily.    Marland Kitchen losartan-hydrochlorothiazide (HYZAAR) 100-25 MG tablet Take 1 tablet by mouth daily. 90 tablet 4  . meloxicam (MOBIC) 7.5 MG tablet Take 1 tablet (7.5 mg total) by mouth daily. 90 tablet 4  . metoprolol succinate (TOPROL-XL) 25 MG 24 hr tablet TAKE 1 TABLET BY MOUTH  DAILY 90 tablet 3  . Multiple Vitamin (MULTIVITAMIN) tablet Take 1 tablet by mouth daily.      . valACYclovir (VALTREX) 1000 MG tablet Take 1 tablet (1,000 mg total) by mouth 2 (two) times daily as needed. 180 tablet 4  . venlafaxine XR (EFFEXOR-XR) 150 MG 24 hr capsule TAKE 1 CAPSULE BY MOUTH  DAILY 90 capsule 3  . XIIDRA 5 % SOLN Place into both eyes.     No facility-administered medications prior to visit.    Allergies  Allergen Reactions  . Penicillins Hives    ROS Review of Systems    Objective:    Physical Exam Constitutional:      Appearance: She is well-developed.  HENT:     Head: Normocephalic and atraumatic.     Right Ear: Tympanic membrane, ear canal and external ear normal.     Left Ear: Tympanic membrane, ear canal and external ear normal.     Nose: Nose normal.  Eyes:     Conjunctiva/sclera: Conjunctivae normal.     Pupils: Pupils are equal, round, and reactive to light.  Neck:     Thyroid: No thyromegaly.  Cardiovascular:     Rate and Rhythm: Normal rate and regular rhythm.     Heart sounds: Normal heart sounds.     Comments: No carotid bruits.  Pulmonary:     Effort: Pulmonary effort is normal.     Breath sounds: Normal breath sounds. No wheezing.  Abdominal:     General: Abdomen is flat. Bowel sounds are normal.     Palpations: Abdomen is soft.  Musculoskeletal:        General: No swelling.     Cervical back: Neck supple.  Lymphadenopathy:     Cervical: No cervical adenopathy.  Skin:    General: Skin is warm and dry.  Neurological:     General: No focal deficit present.     Mental Status: She is alert and oriented to person, place, and time.      Coordination: Coordination normal.     Comments: DEc patellar reflex on the Right  Psychiatric:  Behavior: Behavior normal.     BP (!) 123/55   Pulse 80   Ht 5\' 4"  (1.626 m)   Wt 223 lb (101.2 kg)   LMP 11/13/2008   SpO2 99%   BMI 38.28 kg/m  Wt Readings from Last 3 Encounters:  01/06/20 223 lb (101.2 kg)  10/08/19 232 lb (105.2 kg)  07/02/19 260 lb (117.9 kg)     Health Maintenance Due  Topic Date Due  . OPHTHALMOLOGY EXAM  08/15/2018  . FOOT EXAM  09/05/2018  . INFLUENZA VACCINE  12/15/2019  . PAP SMEAR-Modifier  12/15/2019    There are no preventive care reminders to display for this patient.  Lab Results  Component Value Date   TSH 2.23 04/30/2019   Lab Results  Component Value Date   WBC 6.8 01/06/2020   HGB 13.9 01/06/2020   HCT 40.8 01/06/2020   MCV 90.3 01/06/2020   PLT 264 01/06/2020   Lab Results  Component Value Date   NA 138 11/07/2019   K 3.7 11/07/2019   CO2 29 11/07/2019   GLUCOSE 106 (H) 11/07/2019   BUN 28 (H) 11/07/2019   CREATININE 0.91 11/07/2019   BILITOT 0.9 04/30/2019   ALKPHOS 87 11/22/2016   AST 22 04/30/2019   ALT 24 04/30/2019   PROT 7.1 04/30/2019   ALBUMIN 4.3 11/22/2016   CALCIUM 10.3 11/07/2019   Lab Results  Component Value Date   CHOL 213 (H) 04/30/2019   Lab Results  Component Value Date   HDL 74 04/30/2019   Lab Results  Component Value Date   LDLCALC 117 (H) 04/30/2019   Lab Results  Component Value Date   TRIG 108 04/30/2019   Lab Results  Component Value Date   CHOLHDL 2.9 04/30/2019   Lab Results  Component Value Date   HGBA1C 5.5 01/06/2020      Assessment & Plan:   Problem List Items Addressed This Visit      Endocrine   Diet-controlled diabetes mellitus (HCC)    Well controlled. Continue current regimen. Follow up in  6 mo       Relevant Orders   POCT glycosylated hemoglobin (Hb A1C) (Completed)    Other Visit Diagnoses    Preop general physical exam    -  Primary    Relevant Orders   CBC (Completed)   BASIC METABOLIC PANEL WITH GFR   Albumin   INR/PT   Urinalysis, Routine w reflex microscopic   EKG 12-Lead     Cleared for surgery.  She is overall low risk.  BMI is 38 A1C is 5.5%. EKG shows rate of 65 bpm, NSR with Low voltage QRS, no acute finding.  No old for comparison.    No orders of the defined types were placed in this encounter.   Follow-up: Return if symptoms worsen or fail to improve.   Time spent 35 min in encounter.   01/08/2020, MD

## 2020-01-07 ENCOUNTER — Other Ambulatory Visit: Payer: Self-pay | Admitting: *Deleted

## 2020-01-07 DIAGNOSIS — E876 Hypokalemia: Secondary | ICD-10-CM

## 2020-01-07 LAB — CBC
HCT: 40.8 % (ref 35.0–45.0)
Hemoglobin: 13.9 g/dL (ref 11.7–15.5)
MCH: 30.8 pg (ref 27.0–33.0)
MCHC: 34.1 g/dL (ref 32.0–36.0)
MCV: 90.3 fL (ref 80.0–100.0)
MPV: 11.1 fL (ref 7.5–12.5)
Platelets: 264 Thousand/uL (ref 140–400)
RBC: 4.52 Million/uL (ref 3.80–5.10)
RDW: 13 % (ref 11.0–15.0)
WBC: 6.8 Thousand/uL (ref 3.8–10.8)

## 2020-01-07 LAB — URINALYSIS, ROUTINE W REFLEX MICROSCOPIC
Bilirubin Urine: NEGATIVE
Glucose, UA: NEGATIVE
Hgb urine dipstick: NEGATIVE
Ketones, ur: NEGATIVE
Leukocytes,Ua: NEGATIVE
Nitrite: NEGATIVE
Protein, ur: NEGATIVE
Specific Gravity, Urine: 1.022 (ref 1.001–1.03)
pH: 6.5 (ref 5.0–8.0)

## 2020-01-07 LAB — BASIC METABOLIC PANEL WITH GFR
BUN/Creatinine Ratio: 41 (calc) — ABNORMAL HIGH (ref 6–22)
BUN: 32 mg/dL — ABNORMAL HIGH (ref 7–25)
CO2: 33 mmol/L — ABNORMAL HIGH (ref 20–32)
Calcium: 9.4 mg/dL (ref 8.6–10.4)
Chloride: 104 mmol/L (ref 98–110)
Creat: 0.78 mg/dL (ref 0.50–0.99)
GFR, Est African American: 92 mL/min/{1.73_m2} (ref >=60)
GFR, Est Non African American: 80 mL/min/{1.73_m2} (ref >=60)
Glucose, Bld: 103 mg/dL (ref 65–139)
Potassium: 3.3 mmol/L — ABNORMAL LOW (ref 3.5–5.3)
Sodium: 144 mmol/L (ref 135–146)

## 2020-01-07 LAB — PROTIME-INR
INR: 0.9
Prothrombin Time: 9.9 s (ref 9.0–11.5)

## 2020-01-07 LAB — ALBUMIN: Albumin: 3.8 g/dL (ref 3.6–5.1)

## 2020-01-30 ENCOUNTER — Other Ambulatory Visit: Payer: Self-pay | Admitting: Family Medicine

## 2020-01-30 DIAGNOSIS — I10 Essential (primary) hypertension: Secondary | ICD-10-CM

## 2020-01-31 ENCOUNTER — Other Ambulatory Visit: Payer: Self-pay | Admitting: Family Medicine

## 2020-02-06 ENCOUNTER — Ambulatory Visit (INDEPENDENT_AMBULATORY_CARE_PROVIDER_SITE_OTHER): Payer: Medicare Other | Admitting: Family Medicine

## 2020-02-06 ENCOUNTER — Other Ambulatory Visit: Payer: Self-pay

## 2020-02-06 DIAGNOSIS — Z23 Encounter for immunization: Secondary | ICD-10-CM

## 2020-02-27 ENCOUNTER — Other Ambulatory Visit: Payer: Self-pay | Admitting: Family Medicine

## 2020-02-27 DIAGNOSIS — M25471 Effusion, right ankle: Secondary | ICD-10-CM

## 2020-04-02 ENCOUNTER — Encounter: Payer: Self-pay | Admitting: Family Medicine

## 2020-04-02 ENCOUNTER — Other Ambulatory Visit: Payer: Self-pay

## 2020-04-02 ENCOUNTER — Ambulatory Visit (INDEPENDENT_AMBULATORY_CARE_PROVIDER_SITE_OTHER): Payer: Medicare Other | Admitting: Family Medicine

## 2020-04-02 VITALS — BP 126/69 | HR 72 | Ht 64.0 in | Wt 228.0 lb

## 2020-04-02 DIAGNOSIS — E785 Hyperlipidemia, unspecified: Secondary | ICD-10-CM | POA: Diagnosis not present

## 2020-04-02 DIAGNOSIS — E119 Type 2 diabetes mellitus without complications: Secondary | ICD-10-CM

## 2020-04-02 DIAGNOSIS — F330 Major depressive disorder, recurrent, mild: Secondary | ICD-10-CM | POA: Diagnosis not present

## 2020-04-02 DIAGNOSIS — I10 Essential (primary) hypertension: Secondary | ICD-10-CM

## 2020-04-02 NOTE — Assessment & Plan Note (Signed)
Well controlled.  Stable on current regimen.  F/U in 6 months.

## 2020-04-02 NOTE — Assessment & Plan Note (Signed)
Due for a1C . She is doing well. Following Q6 mo

## 2020-04-02 NOTE — Progress Notes (Signed)
Established Patient Office Visit  Subjective:  Patient ID: Julia Woods, female    DOB: 24-Feb-1956  Age: 64 y.o. MRN: [id number]  CC:  Chief Complaint  Patient presents with  . Hypertension    HPI Julia Woods presents for   Hypertension- Pt denies chest pain, SOB, dizziness, or heart palpitations.  Taking meds as directed w/o problems.  Denies medication side effects.    Diabetes - no hypoglycemic events. No wounds or sores that are not healing well. No increased thirst or urination. Checking glucose at home.   Hyperlipidemia - tolerating stating well with no myalgias or significant side effects.  Lab Results  Component Value Date   CHOL 213 (H) 04/30/2019   HDL 74 04/30/2019   LDLCALC 117 (H) 04/30/2019   TRIG 108 04/30/2019   CHOLHDL 2.9 04/30/2019     Past Medical History:  Diagnosis Date  . Allergy    allergic rhinitis  . Hyperlipidemia   . Hypertension   . Lumbar herniated disc 06/2012   injection, [facility]  . Menopausal state   . Mental disorder    depression  . S/P breast biopsy 1990, 12-2007   benign/Dr. Dwain Sarna at [facility]  . Sleep apnea     Past Surgical History:  Procedure Laterality Date  . ANKLE FRACTURE SURGERY  10-2004   does have hardware/RT  . BREAST BIOPSY  1990   8/09 benign/Dr Dwain Sarna @ [facility]  . BREAST EXCISIONAL BIOPSY Left   . FOOT FRACTURE SURGERY  11-2001   RT  . KNEE ARTHROSCOPY  07-2008   RT/Dr. Despina Hick  . REPLACEMENT TOTAL KNEE  03-2010   RT/Dr. Despina Hick  . STEROID INJECTION TO SCAR     lumbar spine    Family History  Problem Relation Age of Onset  . Cancer Mother 90       colon  . Diabetes Mother   . Stroke Mother   . Heart attack Mother   . Hyperlipidemia Mother   . Hypertension Mother   . Heart attack Father 43  . Hyperlipidemia Other        family history  . Hypertension Other        family history  . Stroke Other        1st degree relative < 50    Social History   Socioeconomic History  . Marital status:  Married    Spouse name: Not on file  . Number of children: Not on file  . Years of education: Not on file  . Highest education level: Not on file  Occupational History  . Not on file  Tobacco Use  . Smoking status: Never Smoker  . Smokeless tobacco: Never Used  Substance and Sexual Activity  . Alcohol use: No  . Drug use: No  . 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  Other Topics Concern  . Not on file  Social History Narrative  . Not on file   Social Determinants of Health   Financial Resource Strain:   . Difficulty of Paying Living Expenses: Not on file  Food Insecurity:   . Worried About Programme researcher, broadcasting/film/video in the Last Year: Not on file  . Ran Out of Food in the Last Year: Not on file  Transportation Needs:   . Lack of Transportation (Medical): Not on file  . Lack of Transportation (Non-Medical): Not on file  Physical Activity:   .  Days of Exercise per Week: Not on file  . Minutes of Exercise per Session: Not on file  Stress:   . Feeling of Stress : Not on file  Social Connections:   . Frequency of Communication with Friends and Family: Not on file  . Frequency of Social Gatherings with Friends and Family: Not on file  . Attends Religious Services: Not on file  . Active Member of Clubs or Organizations: Not on file  . Attends Banker Meetings: Not on file  . Marital Status: Not on file  Intimate Partner Violence:   . Fear of Current or Ex-Partner: Not on file  . Emotionally Abused: Not on file  . Physically Abused: Not on file  . Sexually Abused: Not on file    Outpatient Medications Prior to Visit  Medication Sig Dispense Refill  . AMBULATORY NON FORMULARY MEDICATION Medication Name:  CPAP machine including tubing, filter, humidifier, mask. See sleep study included w/order  Dx OSA - G47.33  [phone] 1 Units 0  .  atorvastatin (LIPITOR) 40 MG tablet TAKE 1 TABLET BY MOUTH AT  BEDTIME 90 tablet 3  . Cholecalciferol (VITAMIN D) 2000 units CAPS Take 1 capsule by mouth daily.    Marland Kitchen losartan-hydrochlorothiazide (HYZAAR) 100-25 MG tablet TAKE 1 TABLET BY MOUTH  DAILY 90 tablet 3  . meloxicam (MOBIC) 7.5 MG tablet TAKE 1 TABLET BY MOUTH  DAILY 90 tablet 3  . metoprolol succinate (TOPROL-XL) 25 MG 24 hr tablet TAKE 1 TABLET BY MOUTH  DAILY 90 tablet 3  . Multiple Vitamin (MULTIVITAMIN) tablet Take 1 tablet by mouth daily.      . valACYclovir (VALTREX) 1000 MG tablet TAKE 1 TABLET BY MOUTH  TWICE DAILY AS NEEDED 180 tablet 3  . venlafaxine XR (EFFEXOR-XR) 150 MG 24 hr capsule TAKE 1 CAPSULE BY MOUTH  DAILY 90 capsule 3  . XIIDRA 5 % SOLN Place into both eyes.     No facility-administered medications prior to visit.    Allergies  Allergen Reactions  . Penicillins Hives    ROS Review of Systems    Objective:    Physical Exam Constitutional:      Appearance: She is well-developed.  HENT:     Head: Normocephalic and atraumatic.  Cardiovascular:     Rate and Rhythm: Normal rate and regular rhythm.     Heart sounds: Normal heart sounds.  Pulmonary:     Effort: Pulmonary effort is normal.     Breath sounds: Normal breath sounds.  Skin:    General: Skin is warm and dry.  Neurological:     Mental Status: She is alert and oriented to person, place, and time.  Psychiatric:        Behavior: Behavior normal.     BP 126/69   Pulse 72   Ht 5\' 4"  (1.626 m)   Wt 228 lb (103.4 kg)   LMP 11/13/2008   SpO2 99%   BMI 39.14 kg/m  Wt Readings from Last 3 Encounters:  04/02/20 228 lb (103.4 kg)  01/06/20 223 lb (101.2 kg)  10/08/19 232 lb (105.2 kg)     Health Maintenance Due  Topic Date Due  . OPHTHALMOLOGY EXAM  08/15/2018  . FOOT EXAM  09/05/2018    There are no preventive care reminders to display for this patient.  Lab Results  Component Value Date   TSH 2.23 04/30/2019   Lab Results   Component Value Date   WBC 6.8 01/06/2020   HGB 13.9  01/06/2020   HCT 40.8 01/06/2020   MCV 90.3 01/06/2020   PLT 264 01/06/2020   Lab Results  Component Value Date   NA 144 01/06/2020   K 3.3 (L) 01/06/2020   CO2 33 (H) 01/06/2020   GLUCOSE 103 01/06/2020   BUN 32 (H) 01/06/2020   CREATININE 0.78 01/06/2020   BILITOT 0.9 04/30/2019   ALKPHOS 87 11/22/2016   AST 22 04/30/2019   ALT 24 04/30/2019   PROT 7.1 04/30/2019   ALBUMIN 4.3 11/22/2016   CALCIUM 9.4 01/06/2020   Lab Results  Component Value Date   CHOL 213 (H) 04/30/2019   Lab Results  Component Value Date   HDL 74 04/30/2019   Lab Results  Component Value Date   LDLCALC 117 (H) 04/30/2019   Lab Results  Component Value Date   TRIG 108 04/30/2019   Lab Results  Component Value Date   CHOLHDL 2.9 04/30/2019   Lab Results  Component Value Date   HGBA1C 5.5 01/06/2020      Assessment & Plan:   Problem List Items Addressed This Visit      Cardiovascular and Mediastinum   HYPERTENSION, BENIGN - Primary    Well controlled. Continue current regimen. Follow up in  6 mo      Relevant Orders   Lipid Panel w/reflex Direct LDL   BASIC METABOLIC PANEL WITH GFR     Endocrine   Diet-controlled diabetes mellitus (HCC)    Due for a1C . She is doing well. Following Q6 mo      Relevant Orders   Lipid Panel w/reflex Direct LDL   BASIC METABOLIC PANEL WITH GFR     Other   Hyperlipidemia    Due to recheck lipids.        Relevant Orders   Lipid Panel w/reflex Direct LDL   BASIC METABOLIC PANEL WITH GFR   Depression, major, recurrent (HCC)    Well controlled.  Stable on current regimen.  F/U in 6 months.          No orders of the defined types were placed in this encounter.   Follow-up: Return in about 6 months (around 09/30/2020) for Hypertension.    Nani Gasser, MD

## 2020-04-02 NOTE — Assessment & Plan Note (Signed)
Due to recheck lipids. 

## 2020-04-02 NOTE — Assessment & Plan Note (Signed)
Well controlled. Continue current regimen. Follow up in  6 mo  

## 2020-04-06 ENCOUNTER — Ambulatory Visit: Payer: Medicare Other | Admitting: Family Medicine

## 2020-04-17 ENCOUNTER — Other Ambulatory Visit: Payer: Self-pay | Admitting: Family Medicine

## 2020-04-17 DIAGNOSIS — F339 Major depressive disorder, recurrent, unspecified: Secondary | ICD-10-CM

## 2020-05-13 LAB — HM DIABETES EYE EXAM

## 2020-06-01 ENCOUNTER — Encounter: Payer: Self-pay | Admitting: Family Medicine

## 2020-07-09 ENCOUNTER — Other Ambulatory Visit: Payer: Self-pay

## 2020-07-09 DIAGNOSIS — I10 Essential (primary) hypertension: Secondary | ICD-10-CM

## 2020-07-09 DIAGNOSIS — F339 Major depressive disorder, recurrent, unspecified: Secondary | ICD-10-CM

## 2020-07-09 MED ORDER — VENLAFAXINE HCL ER 150 MG PO CP24: 150.0000 mg | ORAL_CAPSULE | Freq: Every day | ORAL | 3 refills | Status: DC

## 2020-07-09 MED ORDER — ATORVASTATIN CALCIUM 40 MG PO TABS
40.0000 mg | ORAL_TABLET | Freq: Every day | ORAL | 3 refills | Status: DC
Start: 2020-07-09 — End: 2021-06-22

## 2020-07-09 MED ORDER — LOSARTAN POTASSIUM-HCTZ 100-25 MG PO TABS: 1.0000 | ORAL_TABLET | Freq: Every day | ORAL | 3 refills | Status: DC

## 2020-07-09 MED ORDER — METOPROLOL SUCCINATE ER 25 MG PO TB24
25.0000 mg | ORAL_TABLET | Freq: Every day | ORAL | 3 refills | Status: DC
Start: 2020-07-09 — End: 2021-06-22

## 2020-07-15 ENCOUNTER — Other Ambulatory Visit: Payer: Self-pay | Admitting: *Deleted

## 2020-07-15 DIAGNOSIS — M25471 Effusion, right ankle: Secondary | ICD-10-CM

## 2020-07-15 MED ORDER — MELOXICAM 7.5 MG PO TABS: 7.5000 mg | ORAL_TABLET | Freq: Every day | ORAL | 3 refills | Status: DC

## 2020-09-07 ENCOUNTER — Other Ambulatory Visit: Payer: Self-pay | Admitting: Family Medicine

## 2020-09-07 DIAGNOSIS — Z1231 Encounter for screening mammogram for malignant neoplasm of breast: Secondary | ICD-10-CM

## 2020-09-16 IMAGING — MG DIGITAL DIAGNOSTIC UNILATERAL RIGHT MAMMOGRAM WITH TOMO AND CAD
6 series · 6 of 18 positions shown · non-contrast
Comparison: Previous exams including recent screening mammogram
dated 10/03/2018.

ACR Breast Density Category a: The breast tissue is almost entirely
fatty.

CLINICAL DATA: Patient returns today to evaluate a possible mass
identified within the periareolar RIGHT breast on recent screening
mammogram.

EXAM:
DIGITAL DIAGNOSTIC RIGHT MAMMOGRAM WITH CAD AND TOMO
ULTRASOUND RIGHT BREAST

[R MLO synth-2D]
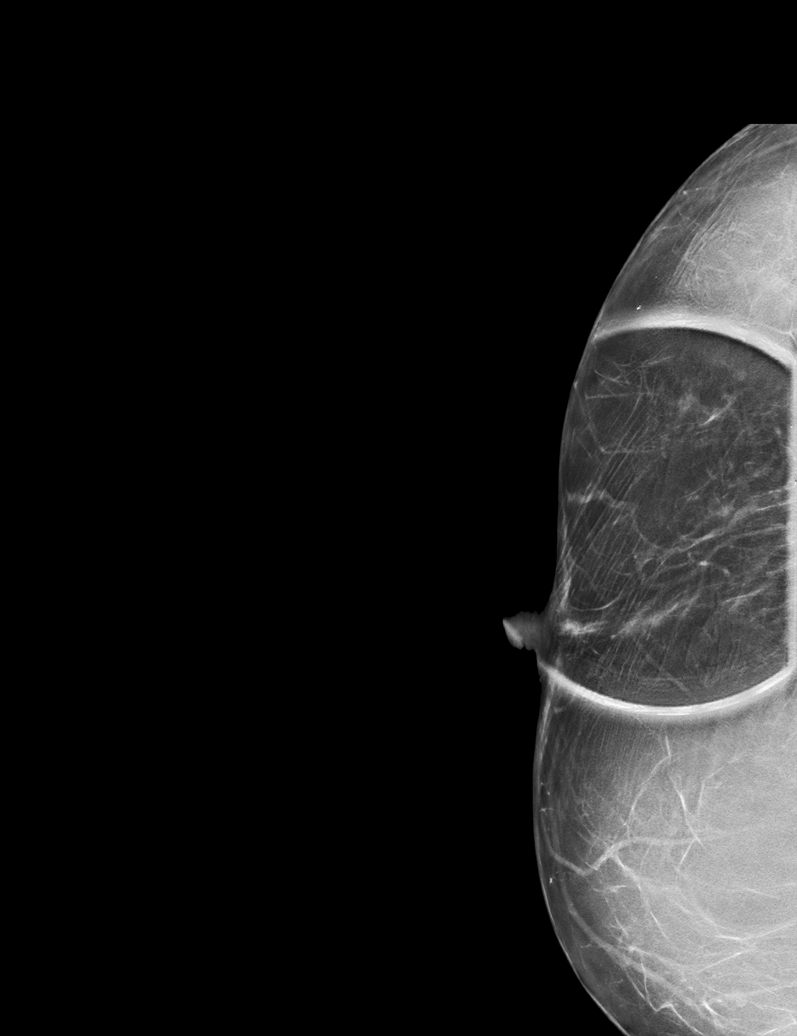

[R ML synth-2D]
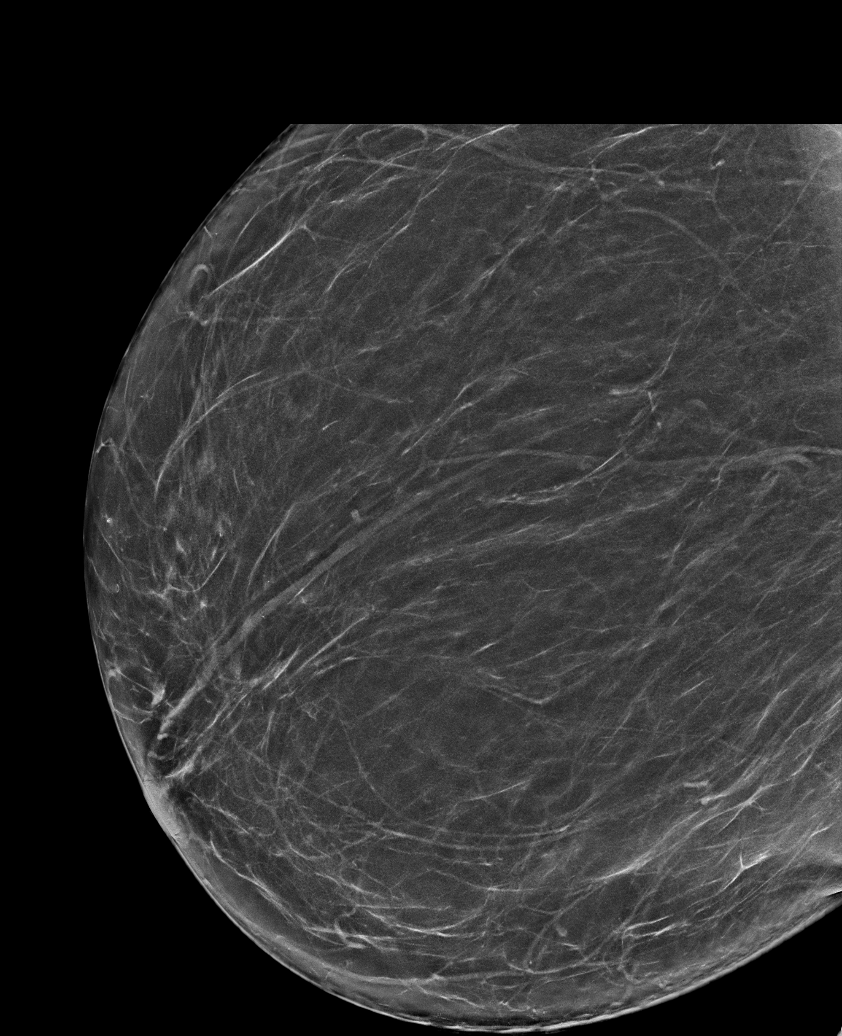

[R CC synth-2D]
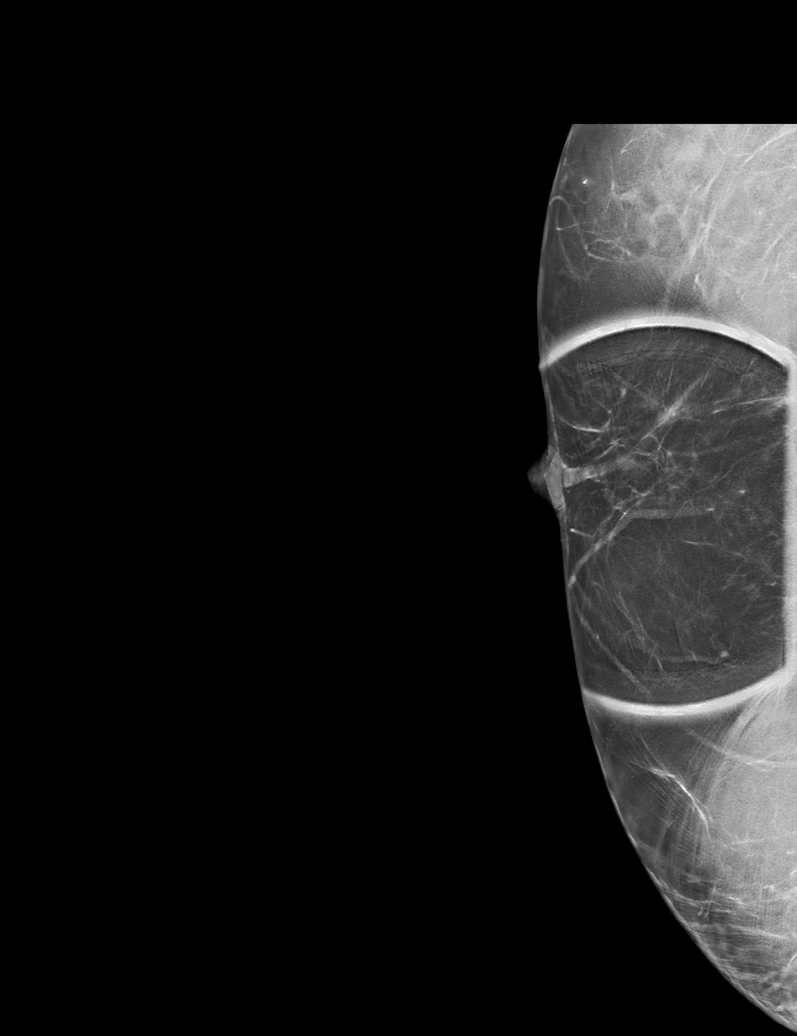

[R ML tomo · tomo slice 41/81.0]
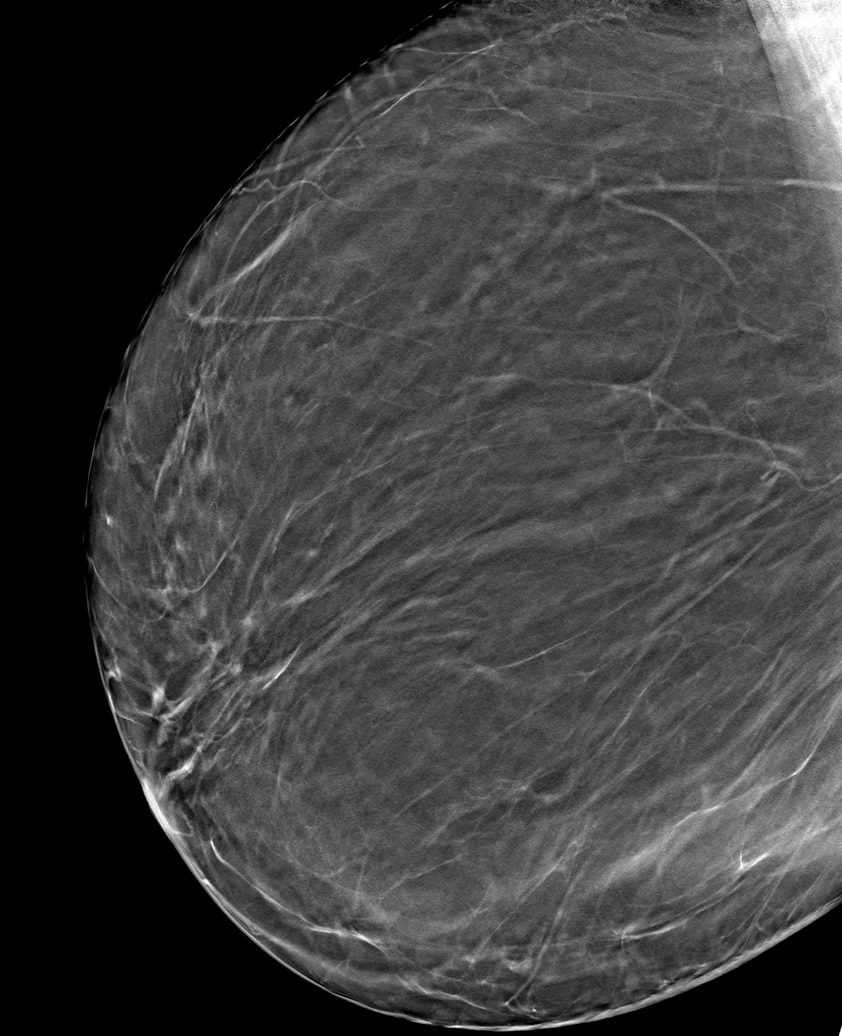

[R MLO tomo · tomo slice 28/55.0]
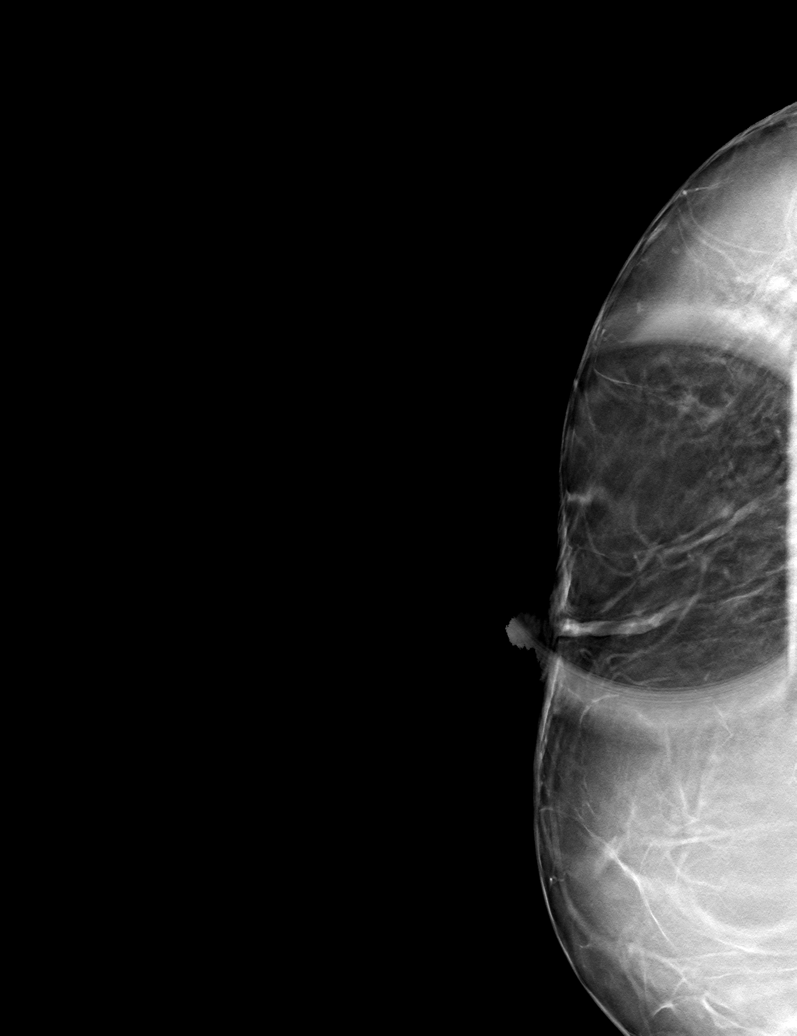

[R CC tomo · tomo slice 28/55.0]
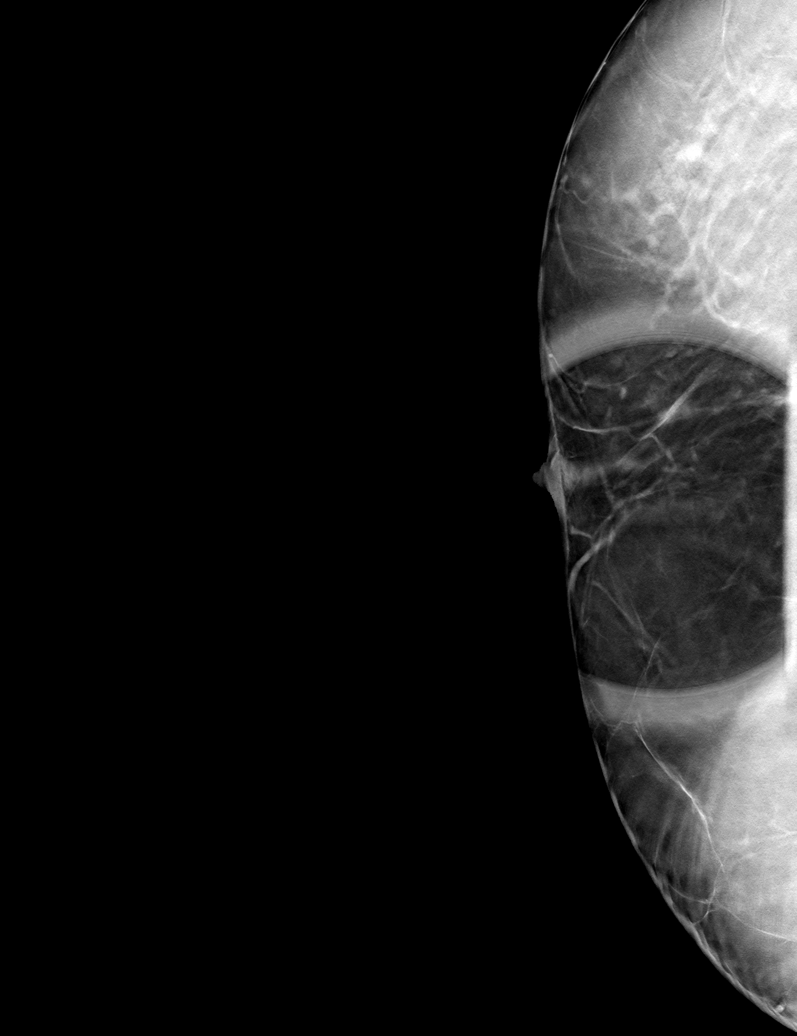

[6 of 18 positions shown; findings below may reference images not displayed]

FINDINGS: On today's additional diagnostic views, including spot compression
with 3D tomosynthesis, there is no persistent abnormality identified
within the periareolar RIGHT breast suggesting superimposition of
fibroglandular tissues and/or periareolar ducts.

Mammographic images were processed with CAD.

Targeted ultrasound is performed, evaluating the subareolar and
periareolar RIGHT breast, showing only normal fibroglandular tissues
and fat lobules. No solid or cystic mass. No dilated ducts.
IMPRESSION: No evidence of malignancy.

Patient may return to routine annual bilateral screening mammogram
schedule.

RECOMMENDATION:
Screening mammogram in one year.(Code:[id number])

I have discussed the findings and recommendations with the patient.
Results were also provided in writing at the conclusion of the
visit. If applicable, a reminder letter will be sent to the patient
regarding the next appointment.

BI-RADS CATEGORY  1: Negative.

## 2020-09-30 ENCOUNTER — Ambulatory Visit: Payer: Medicare Other | Admitting: Family Medicine

## 2020-10-21 ENCOUNTER — Ambulatory Visit (INDEPENDENT_AMBULATORY_CARE_PROVIDER_SITE_OTHER): Payer: Medicare Other

## 2020-10-21 ENCOUNTER — Other Ambulatory Visit: Payer: Self-pay

## 2020-10-21 DIAGNOSIS — Z1231 Encounter for screening mammogram for malignant neoplasm of breast: Secondary | ICD-10-CM

## 2020-11-10 ENCOUNTER — Other Ambulatory Visit: Payer: Self-pay

## 2020-11-10 ENCOUNTER — Ambulatory Visit (INDEPENDENT_AMBULATORY_CARE_PROVIDER_SITE_OTHER): Payer: Medicare Other | Admitting: Family Medicine

## 2020-11-10 ENCOUNTER — Encounter: Payer: Self-pay | Admitting: Family Medicine

## 2020-11-10 VITALS — BP 128/43 | HR 74 | Ht 64.0 in | Wt 260.0 lb

## 2020-11-10 DIAGNOSIS — Z23 Encounter for immunization: Secondary | ICD-10-CM | POA: Diagnosis not present

## 2020-11-10 DIAGNOSIS — E785 Hyperlipidemia, unspecified: Secondary | ICD-10-CM | POA: Diagnosis not present

## 2020-11-10 DIAGNOSIS — K2100 Gastro-esophageal reflux disease with esophagitis, without bleeding: Secondary | ICD-10-CM

## 2020-11-10 DIAGNOSIS — I10 Essential (primary) hypertension: Secondary | ICD-10-CM | POA: Diagnosis not present

## 2020-11-10 DIAGNOSIS — R7303 Prediabetes: Secondary | ICD-10-CM

## 2020-11-10 LAB — POCT GLYCOSYLATED HEMOGLOBIN (HGB A1C): Hemoglobin A1C: 5.3 % (ref 4.0–5.6)

## 2020-11-10 NOTE — Progress Notes (Signed)
Established Patient Office Visit  Subjective:  Patient ID: Julia Woods, female    DOB: 1955/10/10  Age: 65 y.o. MRN: [id number]  CC:  Chief Complaint  Patient presents with   Hypertension   Prediabetes    HPI Julia Woods presents for   Hypertension- Pt denies chest pain, SOB, dizziness, or heart palpitations.  Taking meds as directed w/o problems.  Denies medication side effects.  She does report feeling a little lightheaded and tachycardic 2 times last week.  She says she has not experienced this ever since she was started on metoprolol for similar symptoms.  She was not able to check her blood pressure around that time.  Impaired fasting glucose-no increased thirst or urination. No symptoms consistent with hypoglycemia.  He has been exercising at the pool.  Having more reflux lately. Using Tums.  She did try Nexium for about 2 weeks but then read on the bottle not to continue for more than 14 days so she stopped it.  Past Medical History:  Diagnosis Date   Allergy    allergic rhinitis   Hyperlipidemia    Hypertension    Lumbar herniated disc 06/2012   injection, [facility]   Menopausal state    Mental disorder    depression   S/P breast biopsy 1990, 12-2007   benign/Dr. Dwain Sarna at [facility]   Sleep apnea     Past Surgical History:  Procedure Laterality Date   ANKLE FRACTURE SURGERY  10-2004   does have hardware/RT   BREAST BIOPSY  1990   8/09 benign/Dr Dwain Sarna @ [facility]   BREAST EXCISIONAL BIOPSY Left    FOOT FRACTURE SURGERY  11-2001   RT   KNEE ARTHROSCOPY  07-2008   RT/Dr. Despina Hick   REPLACEMENT TOTAL KNEE  03-2010   RT/Dr. Despina Hick   STEROID INJECTION TO SCAR     lumbar spine    Family History  Problem Relation Age of Onset   Cancer Mother 62       colon   Diabetes Mother    Stroke Mother    Heart attack Mother    Hyperlipidemia Mother    Hypertension Mother    Heart attack Father 74   Hyperlipidemia Other        family history   Hypertension Other         family history   Stroke Other        1st degree relative < 50    Social History   Socioeconomic History   Marital status: Married    Spouse name: Not on file   Number of children: Not on file   Years of education: Not on file   Highest education level: Not on file  Occupational History   Not on file  Tobacco Use   Smoking status: Never   Smokeless tobacco: Never  Substance and Sexual Activity   Alcohol use: No   Drug use: No   Sexual activity: Never    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  Other Topics Concern   Not on file  Social History Narrative   Not on file   Social Determinants of Health   Financial Resource Strain: Not on file  Food Insecurity: Not on file  Transportation Needs: Not on file  Physical Activity: Not on file  Stress: Not on file  Social Connections: Not on file  Intimate Partner Violence: Not on file    Outpatient Medications  Prior to Visit  Medication Sig Dispense Refill   AMBULATORY NON FORMULARY MEDICATION Medication Name:  CPAP machine including tubing, filter, humidifier, mask. See sleep study included w/order  Dx OSA - G47.33  FAX:[phone] 1 Units 0   atorvastatin (LIPITOR) 40 MG tablet Take 1 tablet (40 mg total) by mouth at bedtime. 90 tablet 3   Cholecalciferol (VITAMIN D) 2000 units CAPS Take 1 capsule by mouth daily.     losartan-hydrochlorothiazide (HYZAAR) 100-25 MG tablet Take 1 tablet by mouth daily. 90 tablet 3   meloxicam (MOBIC) 7.5 MG tablet Take 1 tablet (7.5 mg total) by mouth daily. 90 tablet 3   metoprolol succinate (TOPROL-XL) 25 MG 24 hr tablet Take 1 tablet (25 mg total) by mouth daily. 90 tablet 3   Multiple Vitamin (MULTIVITAMIN) tablet Take 1 tablet by mouth daily.       valACYclovir (VALTREX) 1000 MG tablet TAKE 1 TABLET BY MOUTH  TWICE DAILY AS NEEDED 180 tablet 3   venlafaxine XR (EFFEXOR-XR) 150 MG 24 hr capsule  Take 1 capsule (150 mg total) by mouth daily. 90 capsule 3   XIIDRA 5 % SOLN Place into both eyes.     No facility-administered medications prior to visit.    Allergies  Allergen Reactions   Penicillins Hives    ROS Review of Systems    Objective:    Physical Exam Constitutional:      Appearance: Normal appearance. She is well-developed.  HENT:     Head: Normocephalic and atraumatic.  Cardiovascular:     Rate and Rhythm: Normal rate and regular rhythm.     Heart sounds: Normal heart sounds.  Pulmonary:     Effort: Pulmonary effort is normal.     Breath sounds: Normal breath sounds.  Skin:    General: Skin is warm and dry.  Neurological:     Mental Status: She is alert and oriented to person, place, and time.  Psychiatric:        Behavior: Behavior normal.    BP (!) 128/43   Pulse 74   Ht 5\' 4"  (1.626 m)   Wt 260 lb (117.9 kg)   LMP 11/13/2008   SpO2 96%   BMI 44.63 kg/m  Wt Readings from Last 3 Encounters:  11/10/20 260 lb (117.9 kg)  04/02/20 228 lb (103.4 kg)  01/06/20 223 lb (101.2 kg)     Health Maintenance Due  Topic Date Due   COVID-19 Vaccine (3 - Booster) 01/06/2020    There are no preventive care reminders to display for this patient.  Lab Results  Component Value Date   TSH 2.23 04/30/2019   Lab Results  Component Value Date   WBC 6.8 01/06/2020   HGB 13.9 01/06/2020   HCT 40.8 01/06/2020   MCV 90.3 01/06/2020   PLT 264 01/06/2020   Lab Results  Component Value Date   NA 144 01/06/2020   K 3.3 (L) 01/06/2020   CO2 33 (H) 01/06/2020   GLUCOSE 103 01/06/2020   BUN 32 (H) 01/06/2020   CREATININE 0.78 01/06/2020   BILITOT 0.9 04/30/2019   ALKPHOS 87 11/22/2016   AST 22 04/30/2019   ALT 24 04/30/2019   PROT 7.1 04/30/2019   ALBUMIN 4.3 11/22/2016   CALCIUM 9.4 01/06/2020   Lab Results  Component Value Date   CHOL 213 (H) 04/30/2019   Lab Results  Component Value Date   HDL 74 04/30/2019   Lab Results  Component Value  Date   LDLCALC 117 (  H) 04/30/2019   Lab Results  Component Value Date   TRIG 108 04/30/2019   Lab Results  Component Value Date   CHOLHDL 2.9 04/30/2019   Lab Results  Component Value Date   HGBA1C 5.3 11/10/2020      Assessment & Plan:   Problem List Items Addressed This Visit       Cardiovascular and Mediastinum   HYPERTENSION, BENIGN - Primary    Well controlled. Continue current regimen. Follow up in  6 mo       Relevant Orders   COMPLETE METABOLIC PANEL WITH GFR   Lipid Panel w/reflex Direct LDL   POCT glycosylated hemoglobin (Hb A1C) (Completed)     Other   Hyperlipidemia    Recheck liipds.        Relevant Orders   COMPLETE METABOLIC PANEL WITH GFR   Lipid Panel w/reflex Direct LDL   POCT glycosylated hemoglobin (Hb A1C) (Completed)   Other Visit Diagnoses     Prediabetes       Relevant Orders   COMPLETE METABOLIC PANEL WITH GFR   Lipid Panel w/reflex Direct LDL   POCT glycosylated hemoglobin (Hb A1C) (Completed)   Gastroesophageal reflux disease with esophagitis without hemorrhage       Need for pneumococcal vaccine       Relevant Orders   Pneumococcal conjugate vaccine 20-valent (Prevnar 20) (Completed)       GERD -discussed dietary measures.  Additional information and handout provided.  We will start a PPI for 6 to 8 weeks and then taper off over 2 weeks.  Please see patient instructions.  Call if symptoms persist or worsen.  Also encouraged to cut back on caffeine.  Prenvar 20 given today.   No orders of the defined types were placed in this encounter.   Follow-up: Return in about 6 months (around 05/12/2021) for Hypertension.    Nani Gasser, MD

## 2020-11-10 NOTE — Patient Instructions (Signed)
Please restart your Nexium.  Take about 20 to 30 minutes before your first meal of the day.  Continue for 6 to 8 weeks.  If at that point you are doing really well then okay to decrease down to every other day for 2 weeks and then stop at that point.  Continue to work on reducing intake of greasy, spicy, acidic, caffeinated foods and beverages.

## 2020-11-10 NOTE — Assessment & Plan Note (Signed)
Well controlled. Continue current regimen. Follow up in  6 mo  

## 2020-11-10 NOTE — Assessment & Plan Note (Signed)
Recheck liipds.

## 2020-11-10 NOTE — Assessment & Plan Note (Addendum)
Stable. F/U in 6 mo. She has gained 32 lbs in the last 7 months.  Reports inc appetite.    She has been exercising at the pool!!!

## 2020-11-11 LAB — LIPID PANEL W/REFLEX DIRECT LDL
Cholesterol: 196 mg/dL (ref <200)
HDL: 82 mg/dL (ref >=50)
LDL Cholesterol (Calc): 94 mg/dL (calc)
Non-HDL Cholesterol (Calc): 114 mg/dL (calc) (ref <130)
Total CHOL/HDL Ratio: 2.4 (calc) (ref <5.0)
Triglycerides: 100 mg/dL (ref <150)

## 2020-11-11 LAB — COMPLETE METABOLIC PANEL WITH GFR
AG Ratio: 1.5 (calc) (ref 1.0–2.5)
ALT: 18 U/L (ref 6–29)
AST: 20 U/L (ref 10–35)
Albumin: 4.1 g/dL (ref 3.6–5.1)
Alkaline phosphatase (APISO): 87 U/L (ref 37–153)
BUN/Creatinine Ratio: 35 (calc) — ABNORMAL HIGH (ref 6–22)
BUN: 28 mg/dL — ABNORMAL HIGH (ref 7–25)
CO2: 32 mmol/L (ref 20–32)
Calcium: 10 mg/dL (ref 8.6–10.4)
Chloride: 101 mmol/L (ref 98–110)
Creat: 0.8 mg/dL (ref 0.50–0.99)
GFR, Est African American: 89 mL/min/{1.73_m2} (ref >=60)
GFR, Est Non African American: 77 mL/min/{1.73_m2} (ref >=60)
Globulin: 2.8 g/dL (calc) (ref 1.9–3.7)
Glucose, Bld: 98 mg/dL (ref 65–99)
Potassium: 4.3 mmol/L (ref 3.5–5.3)
Sodium: 139 mmol/L (ref 135–146)
Total Bilirubin: 0.9 mg/dL (ref 0.2–1.2)
Total Protein: 6.9 g/dL (ref 6.1–8.1)

## 2020-12-02 ENCOUNTER — Ambulatory Visit (INDEPENDENT_AMBULATORY_CARE_PROVIDER_SITE_OTHER): Payer: Medicare Other | Admitting: Family Medicine

## 2020-12-02 DIAGNOSIS — Z780 Asymptomatic menopausal state: Secondary | ICD-10-CM

## 2020-12-02 DIAGNOSIS — Z0000 Encounter for general adult medical examination without abnormal findings: Secondary | ICD-10-CM

## 2020-12-02 NOTE — Patient Instructions (Addendum)
MEDICARE ANNUAL WELLNESS VISIT Health Maintenance Summary and Written Plan of Care  [name] ,  Thank you for allowing me to perform your Medicare Annual Wellness Visit and for your ongoing commitment to your health.   Health Maintenance & Immunization History Health Maintenance  Topic Date Due   COVID-19 Vaccine (4 - Booster) 12/18/2020 (Originally 08/19/2020)   INFLUENZA VACCINE  12/14/2020   PNA vac Low Risk Adult (2 of 2 - PCV13) 11/10/2021   MAMMOGRAM  10/22/2022   COLONOSCOPY (Pts 45-2217yrs Insurance coverage will need to be confirmed)  04/17/2023   TETANUS/TDAP  10/28/2028   DEXA SCAN  Completed   Hepatitis C Screening  Completed   Zoster Vaccines- Shingrix  Completed   HPV VACCINES  Aged Out   Immunization History  Administered Date(s) Administered   Fluad Quad(high Dose 65+) 02/06/2020   Influenza Split 02/27/2012   Influenza Whole 03/09/2009, 03/13/2010   Influenza,inj,Quad PF,6+ Mos 01/30/2015, 01/01/2016, 01/10/2017, 01/25/2018, 01/28/2019   Influenza,inj,quad, With Preservative 12/14/2016   Influenza-Unspecified 02/26/2013, 02/21/2014   Moderna SARS-COV2 Booster Vaccination 04/20/2020   Moderna Sars-Covid-2 Vaccination 07/15/2019, 08/06/2019   PNEUMOCOCCAL CONJUGATE-20 11/10/2020   Pneumococcal Polysaccharide-23 04/30/2019   Td 10/14/2008   Tdap 10/29/2018   Zoster Recombinat (Shingrix) 03/29/2017, 05/30/2017   Zoster, Live 09/04/2013    These are the patient goals that we discussed:  Goals Addressed               This Visit's Progress     Patient Stated (pt-stated)        12/02/2020 AWV Goal: Exercise for General Health  Patient will verbalize understanding of the benefits of increased physical activity: Exercising regularly is important. It will improve your overall fitness, flexibility, and endurance. Regular exercise also will improve your overall health. It can help you control your weight, reduce stress, and improve your bone density. Over the  next year, patient will increase physical activity as tolerated with a goal of at least 150 minutes of moderate physical activity per week.  You can tell that you are exercising at a moderate intensity if your heart starts beating faster and you start breathing faster but can still hold a conversation. Moderate-intensity exercise ideas include: Walking 1 mile (1.6 km) in about 15 minutes Biking Hiking Golfing Dancing Water aerobics Patient will verbalize understanding of everyday activities that increase physical activity by providing examples like the following: Yard work, such as: Insurance underwriterushing a lawn mower Raking and bagging leaves Washing your car Pushing a stroller Shoveling snow Gardening Washing windows or floors Patient will be able to explain general safety guidelines for exercising:  Before you start a new exercise program, talk with your health care provider. Do not exercise so much that you hurt yourself, feel dizzy, or get very short of breath. Wear comfortable clothes and wear shoes with good support. Drink plenty of water while you exercise to prevent dehydration or heat stroke. Work out until your breathing and your heartbeat get faster.          This is a list of Health Maintenance Items that are overdue or due now: There are no preventive care reminders to display for this patient.   Orders/Referrals Placed Today: Orders Placed This Encounter  Procedures   DEXAScan    Standing Status:   Future    Standing Expiration Date:   12/02/2021    Scheduling Instructions:     Please call patient to schedule.    Order Specific Question:   Reason for exam:  Answer:   Post menopausal    Order Specific Question:   Preferred imaging location?    Answer:   [facility]   (Contact our referral department at [phone] if you have not spoken with someone about your referral appointment within the next 5 days)    Follow-up Plan Follow-up with Agapito Games, MD as planned Bone density referral has been sent and they will call you to schedule. Medicare wellness visit in one year.  Patient will access AVS on mychart.     Bone Density Test A bone density test uses a type of X-ray to measure the amount of calcium and other minerals in a person's bones. It can measure bone density in the hip and the spine. The test is similar to having a regular X-ray. This test may also be called: Bone densitometry. Bone mineral density test. Dual-energy X-ray absorptiometry (DEXA). You may have this test to: Diagnose a condition that causes weak or thin bones (osteoporosis). Screen you for osteoporosis. Predict your risk for a broken bone (fracture). Determine how well your osteoporosis treatment is working. Tell a health care provider about: Any allergies you have. All medicines you are taking, including vitamins, herbs, eye drops, creams, and over-the-counter medicines. Any problems you or family members have had with anesthetic medicines. Any blood disorders you have. Any surgeries you have had. Any medical conditions you have. Whether you are pregnant or may be pregnant. Any medical tests you have had within the past 14 days that used contrast material. What are the risks? Generally, this is a safe test. However, it does expose you to a small amountof radiation, which can slightly increase your cancer risk. What happens before the test? Do not take any calcium supplements within the 24 hours before your test. You will need to remove all metal jewelry, eyeglasses, removable dental appliances, and any other metal objects on your body. What happens during the test?  You will lie down on an exam table. There will be an X-ray generator below you and an imaging device above you. Other devices, such as boxes or braces, may be used to position your body properly for the scan. The machine will slowly scan your body. You will need to keep very still while  the machine does the scan. The images will show up on a screen in the room. Images will be examined by a specialist after your test is finished. The procedure may vary among health care providers and hospitals. What can I expect after the test? It is up to you to get the results of your test. Ask your health care provider,or the department that is doing the test, when your results will be ready. Summary A bone density test is an imaging test that uses a type of X-ray to measure the amount of calcium and other minerals in your bones. The test may be used to diagnose or screen you for a condition that causes weak or thin bones (osteoporosis), predict your risk for a broken bone (fracture), or determine how well your osteoporosis treatment is working. Do not take any calcium supplements within 24 hours before your test. Ask your health care provider, or the department that is doing the test, when your results will be ready. This information is not intended to replace advice given to you by your health care provider. Make sure you discuss any questions you have with your healthcare provider. Document Revised: 10/17/2019 Document Reviewed: 10/17/2019 Elsevier Patient Education  2022 [employer].  Health Maintenance, Female Adopting a healthy lifestyle and getting preventive care are important in promoting health and wellness. Ask your health care provider about: The right schedule for you to have regular tests and exams. Things you can do on your own to prevent diseases and keep yourself healthy. What should I know about diet, weight, and exercise? Eat a healthy diet  Eat a diet that includes plenty of vegetables, fruits, low-fat dairy products, and lean protein. Do not eat a lot of foods that are high in solid fats, added sugars, or sodium.  Maintain a healthy weight Body mass index (BMI) is used to identify weight problems. It estimates body fat based on height and weight. Your health care  provider can help determineyour BMI and help you achieve or maintain a healthy weight. Get regular exercise Get regular exercise. This is one of the most important things you can do for your health. Most adults should: Exercise for at least 150 minutes each week. The exercise should increase your heart rate and make you sweat (moderate-intensity exercise). Do strengthening exercises at least twice a week. This is in addition to the moderate-intensity exercise. Spend less time sitting. Even light physical activity can be beneficial. Watch cholesterol and blood lipids Have your blood tested for lipids and cholesterol at 67 years of age, then havethis test every 5 years. Have your cholesterol levels checked more often if: Your lipid or cholesterol levels are high. You are older than 67 years of age. You are at high risk for heart disease. What should I know about cancer screening? Depending on your health history and family history, you may need to have cancer screening at various ages. This may include screening for: Breast cancer. Cervical cancer. Colorectal cancer. Skin cancer. Lung cancer. What should I know about heart disease, diabetes, and high blood pressure? Blood pressure and heart disease High blood pressure causes heart disease and increases the risk of stroke. This is more likely to develop in people who have high blood pressure readings, are of African descent, or are overweight. Have your blood pressure checked: Every 3-5 years if you are 5-30 years of age. Every year if you are 58 years old or older. Diabetes Have regular diabetes screenings. This checks your fasting blood sugar level. Have the screening done: Once every three years after age 51 if you are at a normal weight and have a low risk for diabetes. More often and at a younger age if you are overweight or have a high risk for diabetes. What should I know about preventing infection? Hepatitis B If you have a  higher risk for hepatitis B, you should be screened for this virus. Talk with your health care provider to find out if you are at risk forhepatitis B infection. Hepatitis C Testing is recommended for: Everyone born from [birth year] through 1967. Anyone with known risk factors for hepatitis C. Sexually transmitted infections (STIs) Get screened for STIs, including gonorrhea and chlamydia, if: You are sexually active and are younger than 67 years of age. You are older than 67 years of age and your health care provider tells you that you are at risk for this type of infection. Your sexual activity has changed since you were last screened, and you are at increased risk for chlamydia or gonorrhea. Ask your health care provider if you are at risk. Ask your health care provider about whether you are at high risk for HIV. Your health care provider may recommend a prescription medicine to  help prevent HIV infection. If you choose to take medicine to prevent HIV, you should first get tested for HIV. You should then be tested every 3 months for as long as you are taking the medicine. Pregnancy If you are about to stop having your period (premenopausal) and you may become pregnant, seek counseling before you get pregnant. Take 400 to 800 micrograms (mcg) of folic acid every day if you become pregnant. Ask for birth control (contraception) if you want to prevent pregnancy. Osteoporosis and menopause Osteoporosis is a disease in which the bones lose minerals and strength with aging. This can result in bone fractures. If you are 96 years old or older, or if you are at risk for osteoporosis and fractures, ask your health care provider if you should: Be screened for bone loss. Take a calcium or vitamin D supplement to lower your risk of fractures. Be given hormone replacement therapy (HRT) to treat symptoms of menopause. Follow these instructions at home: Lifestyle Do not use any products that contain nicotine or  tobacco, such as cigarettes, e-cigarettes, and chewing tobacco. If you need help quitting, ask your health care provider. Do not use street drugs. Do not share needles. Ask your health care provider for help if you need support or information about quitting drugs. Alcohol use Do not drink alcohol if: Your health care provider tells you not to drink. You are pregnant, may be pregnant, or are planning to become pregnant. If you drink alcohol: Limit how much you use to 0-1 drink a day. Limit intake if you are breastfeeding. Be aware of how much alcohol is in your drink. In the [place] one drink equals one 12 oz bottle of beer (355 mL), one 5 oz glass of wine (148 mL), or one 1 oz glass of hard liquor (44 mL). General instructions Schedule regular health, dental, and eye exams. Stay current with your vaccines. Tell your health care provider if: You often feel depressed. You have ever been abused or do not feel safe at home. Summary Adopting a healthy lifestyle and getting preventive care are important in promoting health and wellness. Follow your health care provider's instructions about healthy diet, exercising, and getting tested or screened for diseases. Follow your health care provider's instructions on monitoring your cholesterol and blood pressure. This information is not intended to replace advice given to you by your health care provider. Make sure you discuss any questions you have with your healthcare provider. Document Revised: 04/25/2018 Document Reviewed: 04/25/2018 Elsevier Patient Education  2022 [employer].

## 2020-12-02 NOTE — Progress Notes (Signed)
MEDICARE ANNUAL WELLNESS VISIT  12/02/2020  Telephone Visit Disclaimer This Medicare AWV was conducted by telephone due to national recommendations for restrictions regarding the COVID-19 Pandemic (e.g. social distancing).  I verified, using two identifiers, that I am speaking with Julia Woods or their authorized healthcare agent. I discussed the limitations, risks, security, and privacy concerns of performing an evaluation and management service by telephone and the potential availability of an in-person appointment in the future. The patient expressed understanding and agreed to proceed.  Location of Patient: Home Location of Provider (nurse):  In the office.  Subjective:    Julia Woods is a 65 y.o. female patient of Metheney, Julia Ehlers, MD who had a Medicare Annual Wellness Visit today via telephone. Julia Woods is Retired and lives with their spouse. she has 2 children. she reports that she is socially active and does interact with friends/family regularly. she is moderately physically active and enjoys swimming, reading and crocheting.  Patient Care Team: Julia Games, MD as PCP - General  Advanced Directives 12/02/2020 09/04/2013  Does Patient Have a Medical Advance Directive? Yes Patient does not have advance directive;Patient would like information  Type of Advance Directive Living will;Healthcare Power of Attorney -  Does patient want to make changes to medical advance directive? No - Patient declined -  Copy of Healthcare Power of Attorney in Chart? No - copy requested -  Would patient like information on creating a medical advance directive? - Advance directive brochure given (Outpatient ONLY)    Hospital Utilization Over the Past 12 Months: # of hospitalizations or ER visits: 0 # of surgeries: 1  Review of Systems    Patient reports that her overall health is better compared to last year.  History obtained from chart review and the patient  Patient Reported  Readings (BP, Pulse, CBG, Weight, etc) none  Pain Assessment Pain : No/denies pain     Current Medications & Allergies (verified) Allergies as of 12/02/2020       Reactions   Penicillins Hives        Medication List        Accurate as of December 02, 2020 11:28 AM. If you have any questions, ask your nurse or doctor.          AMBULATORY NON FORMULARY MEDICATION Medication Name:  CPAP machine including tubing, filter, humidifier, mask. See sleep study included w/order  Dx OSA - G47.33  FAX:[phone]   atorvastatin 40 MG tablet Commonly known as: LIPITOR Take 1 tablet (40 mg total) by mouth at bedtime.   esomeprazole 20 MG capsule Commonly known as: NEXIUM Take 20 mg by mouth. daily   losartan-hydrochlorothiazide 100-25 MG tablet Commonly known as: HYZAAR Take 1 tablet by mouth daily.   meloxicam 7.5 MG tablet Commonly known as: MOBIC Take 1 tablet (7.5 mg total) by mouth daily.   metoprolol succinate 25 MG 24 hr tablet Commonly known as: TOPROL-XL Take 1 tablet (25 mg total) by mouth daily.   multivitamin tablet Take 1 tablet by mouth daily.   valACYclovir 1000 MG tablet Commonly known as: VALTREX TAKE 1 TABLET BY MOUTH  TWICE DAILY AS NEEDED   venlafaxine XR 150 MG 24 hr capsule Commonly known as: EFFEXOR-XR Take 1 capsule (150 mg total) by mouth daily.   Vitamin D 50 MCG (2000 UT) Caps Take 1 capsule by mouth daily.   Xiidra 5 % Soln Generic drug: Lifitegrast Place into both eyes.        History (reviewed):  Past Medical History:  Diagnosis Date   Allergy    allergic rhinitis   Arthritis    Cataract    Depression    GERD (gastroesophageal reflux disease)    Hyperlipidemia    Hypertension    Lumbar herniated disc 06/16/2012   injection, [facility]   Menopausal state    Mental disorder    depression   S/P breast biopsy 1990, 12-2007   benign/Dr. Dwain Woods at [facility]   Sleep apnea    Past Surgical History:  Procedure Laterality Date    ANKLE FRACTURE SURGERY  10/2004   does have hardware/RT   BREAST BIOPSY  1990   8/09 benign/Dr Julia Woods @ [facility]   BREAST EXCISIONAL BIOPSY Left    FOOT FRACTURE SURGERY  11/2001   RT   FRACTURE SURGERY  2003,2005   Rt. Foot, Rt ankle   JOINT REPLACEMENT  2011   Rt. Knee   KNEE ARTHROSCOPY  07/2008   RT/Dr. Despina Woods   REPLACEMENT TOTAL KNEE  03/2010   RT/Dr. Despina Woods   STEROID INJECTION TO SCAR     lumbar spine   Family History  Problem Relation Age of Onset   Cancer Mother 50       colon   Diabetes Mother    Stroke Mother    Heart attack Mother    Hyperlipidemia Mother    Hypertension Mother    Arthritis Mother    Heart attack Father 72   Early death Father    Heart disease Father    Hyperlipidemia Father    Hyperlipidemia Other        family history   Hypertension Other        family history   Stroke Other        1st degree relative < 50   Stroke Brother    Social History   Socioeconomic History   Marital status: Married    Spouse name: Julia Woods   Number of children: 2   Years of education: 13   Highest education level: Some college, no degree  Occupational History    Comment: Retired  Tobacco Use   Smoking status: Never   Smokeless tobacco: Never  Substance and Sexual Activity   Alcohol use: Not Currently    Alcohol/week: 1.0 - 2.0 standard drink    Types: 1 - 2 Glasses of wine per week    Comment: occassionally   Drug use: No   Sexual activity: Not Currently    Birth control/protection: Post-menopausal  Other Topics Concern   Not on file  Social History Narrative   Lives with her spouse. She enjoys swimming, reading and crotcheing.    Social Determinants of Health   Financial Resource Strain: Low Risk    Difficulty of Paying Living Expenses: Not hard at all  Food Insecurity: No Food Insecurity   Worried About Programme researcher, broadcasting/film/video in the Last Year: Never true   Ran Out of Food in the Last Year: Never true  Transportation Needs: No Transportation  Needs   Lack of Transportation (Medical): No   Lack of Transportation (Non-Medical): No  Physical Activity: Sufficiently Active   Days of Exercise per Week: 3 days   Minutes of Exercise per Session: 60 min  Stress: No Stress Concern Present   Feeling of Stress : Not at all  Social Connections: Moderately Isolated   Frequency of Communication with Friends and Family: More than three times a week   Frequency of Social Gatherings with Friends and Family:  More than three times a week   Attends Religious Services: Never   Active Member of Clubs or Organizations: No   Attends Banker Meetings: Never   Marital Status: Married    Activities of Daily Living In your present state of health, do you have any difficulty performing the following activities: 12/02/2020  Hearing? N  Vision? N  Difficulty concentrating or making decisions? N  Walking or climbing stairs? N  Dressing or bathing? N  Doing errands, shopping? N  Preparing Food and eating ? N  Using the Toilet? N  In the past six months, have you accidently leaked urine? Y  Comment urgency  Do you have problems with loss of bowel control? N  Managing your Medications? N  Managing your Finances? N  Housekeeping or managing your Housekeeping? N  Some recent data might be hidden    Patient Education/ Literacy How often do you need to have someone help you when you read instructions, pamphlets, or other written materials from your doctor or pharmacy?: 1 - Never What is the last grade level you completed in school?: 12th grade and some college (1 year)  Exercise Current Exercise Habits: Home exercise routine, Type of exercise: walking;Other - see comments (swimming), Time (Minutes): 60, Frequency (Times/Week): 3, Weekly Exercise (Minutes/Week): 180, Intensity: Moderate, Exercise limited by: None identified  Diet Patient reports consuming  2-3  meals a day and 2-3 snack(s) a day Patient reports that her primary diet is:  Regular Patient reports that she does have regular access to food.   Depression Screen PHQ 2/9 Scores 12/02/2020 11/10/2020 04/02/2020 07/02/2019 04/30/2019 10/29/2018 01/25/2018  PHQ - 2 Score 0 2 0 2 2 0 0  PHQ- 9 Score 0 5 0 7 8 1  -     Fall Risk Fall Risk  12/02/2020 11/10/2020 07/02/2019 04/30/2019 01/25/2018  Falls in the past year? 0 0 0 0 No  Number falls in past yr: 0 0 0 0 -  Injury with Fall? 0 0 0 0 -  Risk for fall due to : No Fall Risks No Fall Risks No Fall Risks - -  Follow up Falls evaluation completed Falls prevention discussed;Falls evaluation completed - - -     Objective:  03/27/2018 seemed alert and oriented and she participated appropriately during our telephone visit.  Blood Pressure Weight BMI  BP Readings from Last 3 Encounters:  11/10/20 (!) 128/43  04/02/20 126/69  01/06/20 (!) 123/55   Wt Readings from Last 3 Encounters:  11/10/20 260 lb (117.9 kg)  04/02/20 228 lb (103.4 kg)  01/06/20 223 lb (101.2 kg)   BMI Readings from Last 1 Encounters:  11/10/20 44.63 kg/m    *Unable to obtain current vital signs, weight, and BMI due to telephone visit type  Hearing/Vision  Jaasia did not seem to have difficulty with hearing/understanding during the telephone conversation Reports that she has had a formal eye exam by an eye care professional within the past year Reports that she has not had a formal hearing evaluation within the past year *Unable to fully assess hearing and vision during telephone visit type  Cognitive Function: 6CIT Screen 12/02/2020  What Year? 0 points  What month? 0 points  What time? 0 points  Count back from 20 0 points  Months in reverse 0 points  Repeat phrase 0 points  Total Score 0   (Normal:0-7, Significant for Dysfunction: >8)  Normal Cognitive Function Screening: Yes   Immunization & Health Maintenance Record  Immunization History  Administered Date(s) Administered   Fluad Quad(high Dose 65+) 02/06/2020   Influenza Split  02/27/2012   Influenza Whole 03/09/2009, 03/13/2010   Influenza,inj,Quad PF,6+ Mos 01/30/2015, 01/01/2016, 01/10/2017, 01/25/2018, 01/28/2019   Influenza,inj,quad, With Preservative 12/14/2016   Influenza-Unspecified 02/26/2013, 02/21/2014   Moderna SARS-COV2 Booster Vaccination 04/20/2020   Moderna Sars-Covid-2 Vaccination 07/15/2019, 08/06/2019   PNEUMOCOCCAL CONJUGATE-20 11/10/2020   Pneumococcal Polysaccharide-23 04/30/2019   Td 10/14/2008   Tdap 10/29/2018   Zoster Recombinat (Shingrix) 03/29/2017, 05/30/2017   Zoster, Live 09/04/2013    Health Maintenance  Topic Date Due   COVID-19 Vaccine (4 - Booster) 12/18/2020 (Originally 08/19/2020)   INFLUENZA VACCINE  12/14/2020   PNA vac Low Risk Adult (2 of 2 - PCV13) 11/10/2021   MAMMOGRAM  10/22/2022   COLONOSCOPY (Pts [id number] Insurance coverage will need to be confirmed)  04/17/2023   TETANUS/TDAP  10/28/2028   DEXA SCAN  Completed   Hepatitis C Screening  Completed   Zoster Vaccines- Shingrix  Completed   HPV VACCINES  Aged Out       Assessment  This is a routine wellness examination for Julia Meekeriane Wernick.  Health Maintenance: Due or Overdue There are no preventive care reminders to display for this patient.   Julia Meekeriane Mcdill does not need a referral for Community Assistance: Care Management:   no Social Work:    no Prescription Assistance:  no Nutrition/Diabetes Education:  no   Plan:  Personalized Goals  Goals Addressed               This Visit's Progress     Patient Stated (pt-stated)        12/02/2020 AWV Goal: Exercise for General Health  Patient will verbalize understanding of the benefits of increased physical activity: Exercising regularly is important. It will improve your overall fitness, flexibility, and endurance. Regular exercise also will improve your overall health. It can help you control your weight, reduce stress, and improve your bone density. Over the next year, patient will increase physical  activity as tolerated with a goal of at least 150 minutes of moderate physical activity per week.  You can tell that you are exercising at a moderate intensity if your heart starts beating faster and you start breathing faster but can still hold a conversation. Moderate-intensity exercise ideas include: Walking 1 mile (1.6 km) in about 15 minutes Biking Hiking Golfing Dancing Water aerobics Patient will verbalize understanding of everyday activities that increase physical activity by providing examples like the following: Yard work, such as: Insurance underwriterushing a lawn mower Raking and bagging leaves Washing your car Pushing a stroller Shoveling snow Gardening Washing windows or floors Patient will be able to explain general safety guidelines for exercising:  Before you start a new exercise program, talk with your health care provider. Do not exercise so much that you hurt yourself, feel dizzy, or get very short of breath. Wear comfortable clothes and wear shoes with good support. Drink plenty of water while you exercise to prevent dehydration or heat stroke. Work out until your breathing and your heartbeat get faster.        Personalized Health Maintenance & Screening Recommendations  Bone densitometry screening  Lung Cancer Screening Recommended: no (Low Dose CT Chest recommended if Age 91-80 years, 30 pack-year currently smoking OR have quit w/in past 15 years) Hepatitis C Screening recommended: no HIV Screening recommended: no  Advanced Directives: Written information was not prepared per patient's request.  Referrals & Orders Orders Placed This Encounter  Procedures   DEXAScan    Follow-up Plan Follow-up with Julia Games, MD as planned Bone density referral has been sent and they will call you to schedule. Medicare wellness visit in one year.  Patient will access AVS on mychart.   I have personally reviewed and noted the following in the patient's chart:    Medical and social history Use of alcohol, tobacco or illicit drugs  Current medications and supplements Functional ability and status Nutritional status Physical activity Advanced directives List of other physicians Hospitalizations, surgeries, and ER visits in previous 12 months Vitals Screenings to include cognitive, depression, and falls Referrals and appointments  In addition, I have reviewed and discussed with Julia Woods certain preventive protocols, quality metrics, and best practice recommendations. A written personalized care plan for preventive services as well as general preventive health recommendations is available and can be mailed to the patient at her request.      Modesto Charon, RN  12/02/2020

## 2020-12-15 NOTE — Progress Notes (Signed)
Medical screening examination/treatment was performed by qualified clinical staff member and as supervising physician I was immediately available for consultation/collaboration. I have reviewed documentation and agree with assessment and plan.  Osa Campoli, MD  

## 2020-12-30 ENCOUNTER — Other Ambulatory Visit: Payer: Medicare Other

## 2021-01-08 ENCOUNTER — Telehealth: Payer: Medicare Other | Admitting: Physician Assistant

## 2021-01-08 DIAGNOSIS — L239 Allergic contact dermatitis, unspecified cause: Secondary | ICD-10-CM | POA: Diagnosis not present

## 2021-01-08 MED ORDER — TRIAMCINOLONE ACETONIDE 0.5 % EX CREA: 1.0000 "application " | TOPICAL_CREAM | Freq: Two times a day (BID) | CUTANEOUS | 0 refills | Status: DC

## 2021-01-08 NOTE — Progress Notes (Addendum)
E Visit for Rash  We are sorry that you are not feeling well. Here is how we plan to help!  Based on what you shared with me it looks like you have contact dermatitis.  Contact dermatitis is a skin rash caused by something that touches the skin and causes irritation or inflammation.  Your skin may be red, swollen, dry, cracked, and itch.  The rash should go away in a few days but can last a few weeks.  If you get a rash, it's important to figure out what caused it so the irritant can be avoided in the future. I am prescribing a prescription strength steroid cream to apply to the area twice daily for 2 weeks to help this area fully resolve. If symptoms are not resolving, you need to be evaluated in person. Please see home care below.   HOME CARE:  Take cool showers and avoid direct sunlight. Apply cool compress or wet dressings. Take a bath in an oatmeal bath.  Sprinkle content of one Aveeno packet under running faucet with comfortably warm water.  Bathe for 15-20 minutes, 1-2 times daily.  Pat dry with a towel. Do not rub the rash. Use hydrocortisone cream. Take an antihistamine like Benadryl for widespread rashes that itch.  The adult dose of Benadryl is 25-50 mg by mouth 4 times daily. Caution:  This type of medication may cause sleepiness.  Do not drink alcohol, drive, or operate dangerous machinery while taking antihistamines.  Do not take these medications if you have prostate enlargement.  Read package instructions thoroughly on all medications that you take.  GET HELP RIGHT AWAY IF:  Symptoms don't go away after treatment. Severe itching that persists. If you rash spreads or swells. If you rash begins to smell. If it blisters and opens or develops a yellow-brown crust. You develop a fever. You have a sore throat. You become short of breath.  MAKE SURE YOU:  Understand these instructions. Will watch your condition. Will get help right away if you are not doing well or get  worse.  Thank you for choosing an e-visit.  Your e-visit answers were reviewed by a board certified advanced clinical practitioner to complete your personal care plan. Depending upon the condition, your plan could have included both over the counter or prescription medications.  Please review your pharmacy choice. Make sure the pharmacy is open so you can pick up prescription now. If there is a problem, you may contact your provider through [employer] and have the prescription routed to another pharmacy.  Your safety is important to [place]. If you have drug allergies check your prescription carefully.   For the next 24 hours you can use MyChart to ask questions about today's visit, request a non-urgent call back, or ask for a work or school excuse. You will get an email in the next two days asking about your experience. I hope that your e-visit has been valuable and will speed your recovery.

## 2021-01-08 NOTE — Progress Notes (Signed)
I have spent 5 minutes in review of e-visit questionnaire, review and updating patient chart, medical decision making and response to patient.   Lin Glazier Cody Terrisa Curfman, PA-C    

## 2021-01-13 ENCOUNTER — Ambulatory Visit (INDEPENDENT_AMBULATORY_CARE_PROVIDER_SITE_OTHER): Payer: Medicare Other

## 2021-01-13 ENCOUNTER — Other Ambulatory Visit: Payer: Self-pay

## 2021-01-13 DIAGNOSIS — Z0000 Encounter for general adult medical examination without abnormal findings: Secondary | ICD-10-CM | POA: Diagnosis not present

## 2021-01-13 DIAGNOSIS — Z780 Asymptomatic menopausal state: Secondary | ICD-10-CM | POA: Diagnosis not present

## 2021-01-13 NOTE — Progress Notes (Signed)
Hi Julia Woods, your T score on the bone density test was -1.0 this is actually still in the normal range.  Continue with weightbearing exercise in addition to getting adequate calcium and vitamin D in your diet.  Recommend repeat bone density in about 5 years.

## 2021-02-18 ENCOUNTER — Ambulatory Visit (INDEPENDENT_AMBULATORY_CARE_PROVIDER_SITE_OTHER): Payer: Medicare Other

## 2021-02-18 DIAGNOSIS — Z23 Encounter for immunization: Secondary | ICD-10-CM | POA: Diagnosis not present

## 2021-03-08 ENCOUNTER — Encounter: Payer: Self-pay | Admitting: Family Medicine

## 2021-05-19 ENCOUNTER — Other Ambulatory Visit: Payer: Self-pay

## 2021-05-19 ENCOUNTER — Ambulatory Visit (INDEPENDENT_AMBULATORY_CARE_PROVIDER_SITE_OTHER): Payer: Medicare Other | Admitting: Family Medicine

## 2021-05-19 ENCOUNTER — Encounter: Payer: Self-pay | Admitting: Family Medicine

## 2021-05-19 VITALS — BP 116/58 | HR 74 | Wt 270.0 lb

## 2021-05-19 DIAGNOSIS — R21 Rash and other nonspecific skin eruption: Secondary | ICD-10-CM | POA: Diagnosis not present

## 2021-05-19 DIAGNOSIS — M25471 Effusion, right ankle: Secondary | ICD-10-CM

## 2021-05-19 DIAGNOSIS — I10 Essential (primary) hypertension: Secondary | ICD-10-CM

## 2021-05-19 DIAGNOSIS — K2100 Gastro-esophageal reflux disease with esophagitis, without bleeding: Secondary | ICD-10-CM | POA: Diagnosis not present

## 2021-05-19 DIAGNOSIS — R7301 Impaired fasting glucose: Secondary | ICD-10-CM | POA: Diagnosis not present

## 2021-05-19 DIAGNOSIS — K219 Gastro-esophageal reflux disease without esophagitis: Secondary | ICD-10-CM | POA: Insufficient documentation

## 2021-05-19 DIAGNOSIS — M1990 Unspecified osteoarthritis, unspecified site: Secondary | ICD-10-CM | POA: Insufficient documentation

## 2021-05-19 DIAGNOSIS — M159 Polyosteoarthritis, unspecified: Secondary | ICD-10-CM

## 2021-05-19 LAB — POCT GLYCOSYLATED HEMOGLOBIN (HGB A1C): Hemoglobin A1C: 5.6 % (ref 4.0–5.6)

## 2021-05-19 MED ORDER — MELOXICAM 7.5 MG PO TABS: 7.5000 mg | ORAL_TABLET | Freq: Every day | ORAL | 3 refills | Status: DC

## 2021-05-19 NOTE — Progress Notes (Signed)
Established Patient Office Visit  Subjective:  Patient ID: Julia Woods, female    DOB: 06/09/1955  Age: 66 y.o. MRN: [id number]  CC:  Chief Complaint  Patient presents with   Hypertension   ifg    HPI Julia Woods presents for   Hypertension- Pt denies chest pain, SOB, dizziness, or heart palpitations.  Taking meds as directed w/o problems.  Denies medication side effects.    She wants to restart her NSAID.  Has been off of it for a little while especially with the GERD and says her joint pain is just really ramped up.  Follow-up GERD-she tried to taper off of her reflux medicine but says the symptoms came back significantly.  So she is now taking it every other day and that seems to be doing well to manage her symptoms.  Impaired fasting glucose-no increased thirst or urination. No symptoms consistent with hypoglycemia.  Also been getting a recurrent rash just on her chest.  She denies any changes in soaps detergents fabric softeners etc. she does not typically wear jewelry around her neck.  She says it gets very itchy and feels bumpy.  It does respond to a topical steroid.  When she finishes the steroid it usually good for about 2 weeks and then it seems to be coming back.  Has been seeing Dr. Seymour Bars for weight management.  She did refer her to therapy for lymphedema.  So she is getting ready to order some compression stockings.  Past Medical History:  Diagnosis Date   Allergy    allergic rhinitis   Arthritis    Cataract    Depression    GERD (gastroesophageal reflux disease)    Hyperlipidemia    Hypertension    Lumbar herniated disc 06/16/2012   injection, [facility]   Menopausal state    Mental disorder    depression   S/P breast biopsy 1990, 12-2007   benign/Dr. Dwain Sarna at [facility]   Sleep apnea     Past Surgical History:  Procedure Laterality Date   ANKLE FRACTURE SURGERY  10/2004   does have hardware/RT   BREAST BIOPSY  1990   8/09 benign/Dr Dwain Sarna @ [facility]    BREAST EXCISIONAL BIOPSY Left    FOOT FRACTURE SURGERY  11/2001   RT   FRACTURE SURGERY  2003,2005   Rt. Foot, Rt ankle   JOINT REPLACEMENT  2011   Rt. Knee   KNEE ARTHROSCOPY  07/2008   RT/Dr. Despina Hick   REPLACEMENT TOTAL KNEE  03/2010   RT/Dr. Despina Hick   STEROID INJECTION TO SCAR     lumbar spine    Family History  Problem Relation Age of Onset   Cancer Mother 64       colon   Diabetes Mother    Stroke Mother    Heart attack Mother    Hyperlipidemia Mother    Hypertension Mother    Arthritis Mother    Heart attack Father 19   Early death Father    Heart disease Father    Hyperlipidemia Father    Hyperlipidemia Other        family history   Hypertension Other        family history   Stroke Other        1st degree relative < 50   Stroke Brother     Social History   Socioeconomic History   Marital status: Married    Spouse name: Harvie Heck   Number of children: 2  Years of education: 41   Highest education level: Some college, no degree  Occupational History    Comment: Retired  Tobacco Use   Smoking status: Never   Smokeless tobacco: Never  Substance and Sexual Activity   Alcohol use: Not Currently    Alcohol/week: 1.0 - 2.0 standard drink    Types: 1 - 2 Glasses of wine per week    Comment: occassionally   Drug use: No   Sexual activity: Not Currently    Birth control/protection: Post-menopausal  Other Topics Concern   Not on file  Social History Narrative   Lives with her spouse. She enjoys swimming, reading and crotcheing.    Social Determinants of Health   Financial Resource Strain: Low Risk    Difficulty of Paying Living Expenses: Not hard at all  Food Insecurity: No Food Insecurity   Worried About Programme researcher, broadcasting/film/video in the Last Year: Never true   Ran Out of Food in the Last Year: Never true  Transportation Needs: No Transportation Needs   Lack of Transportation (Medical): No   Lack of Transportation (Non-Medical): No  Physical Activity:  Sufficiently Active   Days of Exercise per Week: 3 days   Minutes of Exercise per Session: 60 min  Stress: No Stress Concern Present   Feeling of Stress : Not at all  Social Connections: Moderately Isolated   Frequency of Communication with Friends and Family: More than three times a week   Frequency of Social Gatherings with Friends and Family: More than three times a week   Attends Religious Services: Never   Database administrator or Organizations: No   Attends Engineer, structural: Never   Marital Status: Married  Catering manager Violence: Not At Risk   Fear of Current or Ex-Partner: No   Emotionally Abused: No   Physically Abused: No   Sexually Abused: No    Outpatient Medications Prior to Visit  Medication Sig Dispense Refill   AMBULATORY NON FORMULARY MEDICATION Medication Name:  CPAP machine including tubing, filter, humidifier, mask. See sleep study included w/order  Dx OSA - G47.33  FAX:[phone] 1 Units 0   atorvastatin (LIPITOR) 40 MG tablet Take 1 tablet (40 mg total) by mouth at bedtime. 90 tablet 3   Cholecalciferol (VITAMIN D) 2000 units CAPS Take 1 capsule by mouth daily.     esomeprazole (NEXIUM) 20 MG capsule Take 20 mg by mouth. daily     losartan-hydrochlorothiazide (HYZAAR) 100-25 MG tablet Take 1 tablet by mouth daily. 90 tablet 3   metoprolol succinate (TOPROL-XL) 25 MG 24 hr tablet Take 1 tablet (25 mg total) by mouth daily. 90 tablet 3   Multiple Vitamin (MULTIVITAMIN) tablet Take 1 tablet by mouth daily.       valACYclovir (VALTREX) 1000 MG tablet TAKE 1 TABLET BY MOUTH  TWICE DAILY AS NEEDED 180 tablet 3   venlafaxine XR (EFFEXOR-XR) 150 MG 24 hr capsule Take 1 capsule (150 mg total) by mouth daily. 90 capsule 3   XIIDRA 5 % SOLN Place into both eyes.     meloxicam (MOBIC) 7.5 MG tablet Take 1 tablet (7.5 mg total) by mouth daily. (Patient not taking: Reported on 12/02/2020) 90 tablet 3   triamcinolone cream (KENALOG) 0.5 % Apply 1 application  topically 2 (two) times daily. 30 g 0   No facility-administered medications prior to visit.    Allergies  Allergen Reactions   Penicillins Hives    ROS Review of Systems  Objective:    Physical Exam Constitutional:      Appearance: Normal appearance. She is well-developed.  HENT:     Head: Normocephalic and atraumatic.  Cardiovascular:     Rate and Rhythm: Normal rate and regular rhythm.     Heart sounds: Normal heart sounds.  Pulmonary:     Effort: Pulmonary effort is normal.     Breath sounds: Normal breath sounds.  Skin:    General: Skin is warm and dry.  Neurological:     Mental Status: She is alert and oriented to person, place, and time.  Psychiatric:        Behavior: Behavior normal.    BP (!) 116/58    Pulse 74    Wt 270 lb (122.5 kg)    LMP 11/13/2008    SpO2 97%    BMI 46.35 kg/m  Wt Readings from Last 3 Encounters:  05/19/21 270 lb (122.5 kg)  11/10/20 260 lb (117.9 kg)  04/02/20 228 lb (103.4 kg)     Health Maintenance Due  Topic Date Due   COVID-19 Vaccine (3 - Booster) 06/15/2020    There are no preventive care reminders to display for this patient.  Lab Results  Component Value Date   TSH 2.23 04/30/2019   Lab Results  Component Value Date   WBC 6.8 01/06/2020   HGB 13.9 01/06/2020   HCT 40.8 01/06/2020   MCV 90.3 01/06/2020   PLT 264 01/06/2020   Lab Results  Component Value Date   NA 139 11/10/2020   K 4.3 11/10/2020   CO2 32 11/10/2020   GLUCOSE 98 11/10/2020   BUN 28 (H) 11/10/2020   CREATININE 0.80 11/10/2020   BILITOT 0.9 11/10/2020   ALKPHOS 87 11/22/2016   AST 20 11/10/2020   ALT 18 11/10/2020   PROT 6.9 11/10/2020   ALBUMIN 4.3 11/22/2016   CALCIUM 10.0 11/10/2020   Lab Results  Component Value Date   CHOL 196 11/10/2020   Lab Results  Component Value Date   HDL 82 11/10/2020   Lab Results  Component Value Date   LDLCALC 94 11/10/2020   Lab Results  Component Value Date   TRIG 100 11/10/2020    Lab Results  Component Value Date   CHOLHDL 2.4 11/10/2020   Lab Results  Component Value Date   HGBA1C 5.6 05/19/2021      Assessment & Plan:   Problem List Items Addressed This Visit       Cardiovascular and Mediastinum   HYPERTENSION, BENIGN - Primary    Well controlled. Continue current regimen. Follow up in  6 mo       Relevant Orders   BASIC METABOLIC PANEL WITH GFR     Digestive   GERD (gastroesophageal reflux disease)    Try to taper completely off of her PPI but could not so she is taking it twice a day for maintenance.        Endocrine   IFG (impaired fasting glucose)    A1c looks great today at 5.6.  Continue current regimen continue to work on healthy food choices and increasing activity levels.  Not currently engaged in exercise.  I think should be a great candidate to do some pool exercises since she does have a lot of arthritis and joint pain.  Follow-up in 6 months.      Relevant Orders   POCT glycosylated hemoglobin (Hb A1C) (Completed)   BASIC METABOLIC PANEL WITH GFR     Musculoskeletal and Integument  Osteoarthritis   Relevant Medications   meloxicam (MOBIC) 7.5 MG tablet     Other   Right ankle swelling   Other Visit Diagnoses     Rash           Rash - area on her chest looks good right now.  Seems to respond to a steroid.    Meds ordered this encounter  Medications   meloxicam (MOBIC) 7.5 MG tablet    Sig: Take 1 tablet (7.5 mg total) by mouth daily.    Dispense:  90 tablet    Refill:  3    Requesting 1 year supply    Follow-up: Return in about 6 months (around 11/16/2021) for Hypertension.    Nani Gasseratherine Derak Schurman, MD

## 2021-05-19 NOTE — Progress Notes (Signed)
Pt would like to restart Mobic.

## 2021-05-19 NOTE — Assessment & Plan Note (Signed)
Try to taper completely off of her PPI but could not so she is taking it twice a day for maintenance.

## 2021-05-19 NOTE — Assessment & Plan Note (Signed)
A1c looks great today at 5.6.  Continue current regimen continue to work on healthy food choices and increasing activity levels.  Not currently engaged in exercise.  I think should be a great candidate to do some pool exercises since she does have a lot of arthritis and joint pain.  Follow-up in 6 months.

## 2021-05-19 NOTE — Assessment & Plan Note (Signed)
Well controlled. Continue current regimen. Follow up in  6 mo  

## 2021-05-20 LAB — BASIC METABOLIC PANEL WITH GFR
BUN: 21 mg/dL (ref 7–25)
CO2: 26 mmol/L (ref 20–32)
Calcium: 9.8 mg/dL (ref 8.6–10.4)
Chloride: 103 mmol/L (ref 98–110)
Creat: 0.85 mg/dL (ref 0.50–1.05)
Glucose, Bld: 104 mg/dL — ABNORMAL HIGH (ref 65–99)
Potassium: 4.5 mmol/L (ref 3.5–5.3)
Sodium: 140 mmol/L (ref 135–146)
eGFR: 75 mL/min/{1.73_m2} (ref >=60)

## 2021-05-20 NOTE — Progress Notes (Signed)
Your lab work is within acceptable range and there are no concerning findings.   ?

## 2021-06-21 ENCOUNTER — Other Ambulatory Visit: Payer: Self-pay | Admitting: Family Medicine

## 2021-06-21 DIAGNOSIS — I10 Essential (primary) hypertension: Secondary | ICD-10-CM

## 2021-06-21 DIAGNOSIS — F339 Major depressive disorder, recurrent, unspecified: Secondary | ICD-10-CM

## 2021-07-01 LAB — HM DIABETES EYE EXAM

## 2021-09-13 ENCOUNTER — Other Ambulatory Visit: Payer: Self-pay | Admitting: Family Medicine

## 2021-09-13 DIAGNOSIS — Z1231 Encounter for screening mammogram for malignant neoplasm of breast: Secondary | ICD-10-CM

## 2021-10-19 ENCOUNTER — Ambulatory Visit (INDEPENDENT_AMBULATORY_CARE_PROVIDER_SITE_OTHER): Payer: Medicare Other | Admitting: Family Medicine

## 2021-10-19 ENCOUNTER — Encounter: Payer: Self-pay | Admitting: Family Medicine

## 2021-10-19 VITALS — BP 121/61 | HR 80 | Resp 18 | Ht 64.0 in | Wt 288.0 lb

## 2021-10-19 DIAGNOSIS — M542 Cervicalgia: Secondary | ICD-10-CM | POA: Diagnosis not present

## 2021-10-19 DIAGNOSIS — R519 Headache, unspecified: Secondary | ICD-10-CM

## 2021-10-19 DIAGNOSIS — H8112 Benign paroxysmal vertigo, left ear: Secondary | ICD-10-CM | POA: Diagnosis not present

## 2021-10-19 DIAGNOSIS — R9389 Abnormal findings on diagnostic imaging of other specified body structures: Secondary | ICD-10-CM

## 2021-10-19 NOTE — Progress Notes (Signed)
Established Patient Office Visit  Subjective   Patient ID: Julia Woods, female    DOB: 08/24/1955  Age: 66 y.o. MRN: [id number]  Chief Complaint  Patient presents with   Discuss Abnormal CT     CT scan done on 10/11/21. Patient complains of headaches in the back of her head, ear fullness and some dizziness. Patient currently holding Meclizine due to drowsiness.      HPI She woke up 1 morning on May 29 feeling suddenly dizzy.  She said she just sat there for a while eventually she was able to get up and go to the bathroom and then sensation started again.  She calm down a little bit and she was finally able to make it to the kitchen where she had some orange juice and then it became much more intense to the point that she felt sweaty and very nauseated.  It persisted to the point that she ended up going to an urgent care to be evaluated.  They recommended that she go to an ED after a brief evaluation and normal EKG.  The emergency department ended up doing a CT angio of the head and neck which was essentially negative.  They did some blood work including a troponin which was also negative.  And discharged her home.  She has been having some neck pain and headaches on and off for the last several weeks.  She says she knows that she has a herniated cervical disc but it has not really been bothersome in over a decade.  A couple of weeks ago she actually had a migraine which she had not had since she got into menopause.    Patient currently holding Meclizine due to drowsiness.    ROS    Objective:     BP 121/61   Pulse 80   Resp 18   Ht 5\' 4"  (1.626 m)   Wt 288 lb (130.6 kg)   LMP 11/13/2008   SpO2 95%   BMI 49.44 kg/m    Physical Exam Vitals reviewed.  Constitutional:      Appearance: She is well-developed.  HENT:     Head: Normocephalic and atraumatic.     Right Ear: Tympanic membrane, ear canal and external ear normal.     Left Ear: Tympanic membrane, ear canal and  external ear normal.  Eyes:     Conjunctiva/sclera: Conjunctivae normal.  Cardiovascular:     Rate and Rhythm: Normal rate.  Pulmonary:     Effort: Pulmonary effort is normal.  Skin:    General: Skin is dry.     Coloration: Skin is not pale.  Neurological:     Mental Status: She is alert and oriented to person, place, and time.     Comments: She did have some mild symptoms with a Dix-Hallpike maneuver to the right but had nystagmus with Dix-Hallpike maneuver to the left.  She also felt worse going from lying to sitting up.  Psychiatric:        Mood and Affect: Mood normal.        Behavior: Behavior normal.     No results found for any visits on 10/19/21.    The 10-year ASCVD risk score (Arnett DK, et al., 2019) is: 13.5%    Assessment & Plan:   Problem List Items Addressed This Visit   None Visit Diagnoses     Benign paroxysmal positional vertigo of left ear    -  Primary   Cervical pain  Relevant Orders   Ambulatory referral to Physical Therapy   Frequent headaches       Relevant Orders   Ambulatory referral to Physical Therapy   Abnormal CT scan, neck       Relevant Orders   Ambulatory referral to Vascular Surgery      Benign positional vertigo-recommend referral for vestibular rehab she still has some meclizine that she can take as needed.  I think that this should 100% improve and treat her symptoms.  She has been having an increase in neck pain and headaches over the last couple of weeks as well so I think we could have physical therapy also work on her cervical spine and see if this is helpful.  If neck pain and/or headaches continue then please let us know.  Abnormal CT scan-did review the CT report that she brought with her from the emergency department.  Everything looked normal in regards to acute findings though they did see a 0.1 cm possible aneurysm versus infundibulum projecting laterally from the left cavernous ICA.  So I will make a referral so  that that can be monitored.  We will make referral to vascular.  I think this may be too small to just monitor with Doppler.  Return if symptoms worsen or fail to improve.    Nani Gasser, MD

## 2021-10-27 ENCOUNTER — Ambulatory Visit: Payer: Medicare Other | Attending: Family Medicine | Admitting: Physical Therapy

## 2021-10-27 DIAGNOSIS — R293 Abnormal posture: Secondary | ICD-10-CM | POA: Diagnosis present

## 2021-10-27 DIAGNOSIS — M542 Cervicalgia: Secondary | ICD-10-CM | POA: Insufficient documentation

## 2021-10-27 DIAGNOSIS — R42 Dizziness and giddiness: Secondary | ICD-10-CM | POA: Diagnosis present

## 2021-10-27 DIAGNOSIS — R519 Headache, unspecified: Secondary | ICD-10-CM | POA: Diagnosis not present

## 2021-10-28 NOTE — Therapy (Signed)
[facility] [place] [address] [place], [place], [address] Phone: [phone]   Fax:  [phone]  Physical Therapy Evaluation  Patient Details  Name: Julia Woods MRN: [id number] Date of Birth: [birth year] Referring Provider (PT): Agapito Games, MD   Encounter Date: 10/27/2021   PT End of Session - 10/27/21 1529     Visit Number 1    Number of Visits 12    Date for PT Re-Evaluation 12/08/21    Authorization Type Medicare    PT Start Time 1447    PT Stop Time 1530    PT Time Calculation (min) 43 min    Activity Tolerance Patient tolerated treatment well    Behavior During Therapy [facility] for tasks assessed/performed             Past Medical History:  Diagnosis Date   Allergy    allergic rhinitis   Arthritis    Cataract    Depression    GERD (gastroesophageal reflux disease)    Hyperlipidemia    Hypertension    Lumbar herniated disc 06/16/2012   injection, [facility]   Menopausal state    Mental disorder    depression   S/P breast biopsy 1990, 12-2007   benign/Dr. Dwain Sarna at [facility]   Sleep apnea     Past Surgical History:  Procedure Laterality Date   ANKLE FRACTURE SURGERY  10/2004   does have hardware/RT   BREAST BIOPSY  1990   8/09 benign/Dr Dwain Sarna @ [facility]   BREAST EXCISIONAL BIOPSY Left    FOOT FRACTURE SURGERY  11/2001   RT   FRACTURE SURGERY  2003,2005   Rt. Foot, Rt ankle   JOINT REPLACEMENT  2011   Rt. Knee   KNEE ARTHROSCOPY  07/2008   RT/Dr. Despina Hick   REPLACEMENT TOTAL KNEE  03/2010   RT/Dr. Despina Hick   STEROID INJECTION TO SCAR     lumbar spine    There were no vitals filed for this visit.    Subjective Assessment - 10/27/21 1450     Subjective Pt states she woke up on Memorial Day with the room spinning. Pt reports getting EKG and blood sugar at Urgent Care; recommended to go to ED. Given medication for vertigo. Does not take it all the time because it makes her sleepy. Pt also notes  developing neck pain since her dizziness began.    Patient is accompained by: Family member   grand daughter   Limitations Standing;Walking    How long can you sit comfortably? n/a    How long can you stand comfortably? n/a    How long can you walk comfortably? n/a    Patient Stated Goals Improve dizziness and neck pain                [facility] PT Assessment - 10/28/21 0001       Assessment   Medical Diagnosis M54.2 (ICD-10-CM) - Cervical pain  R51.9 (ICD-10-CM) - Frequent headaches    Referring Provider (PT) Agapito Games, MD    Onset Date/Surgical Date --   Memorial Day 2023   Prior Therapy None      Precautions   Precautions None      Restrictions   Weight Bearing Restrictions No      Balance Screen   Has the patient fallen in the past 6 months No      Home Environment   Living Environment Private residence    Available Help at Discharge Family  Type of Home House      Observation/Other Assessments   Focus on Therapeutic Outcomes (FOTO)  n/a                    Vestibular Assessment - 10/28/21 0001       Symptom Behavior   Subjective history of current problem "My head feels woozy. Comes and goes."    Type of Dizziness  "Funny feeling in head"   Initially spinning. "Head feels like its off my body"   Frequency of Dizziness Varies    Duration of Dizziness A couple of hours    Symptom Nature Motion provoked    Aggravating Factors Forward bending;Turning head quickly;Supine to sit   more in the afternoons lately   Relieving Factors Slow movements;Rest    Progression of Symptoms Better    History of similar episodes ~20 years ago she had dizziness      Oculomotor Exam   Oculomotor Alignment Normal    Ocular ROM WFL    Spontaneous Absent    Gaze-induced  Absent    Smooth Pursuits Intact    Saccades Hypometric   Reports increased symptoms with vertical eye movement     Oculomotor Exam-Fixation Suppressed    Left Head Impulse (+)initially  with corrective saccades but able to maintain on subsequent trials    Right Head Impulse (-)      Vestibulo-Ocular Reflex   VOR 1 Head Only (x 1 viewing) WFL    VOR to Slow Head Movement --   Reports symptoms with R head turn   VOR Cancellation Normal      Positional Testing   Dix-Hallpike Dix-Hallpike Right;Dix-Hallpike Left    Sidelying Test Sidelying Right;Sidelying Left    Horizontal Canal Testing Horizontal Canal Right;Horizontal Canal Left      Dix-Hallpike Right   Dix-Hallpike Right Duration ~6 sec with ~8 sec latency    Dix-Hallpike Right Symptoms Upbeat, right rotatory nystagmus   slow     Dix-Hallpike Left   Dix-Hallpike Left Duration ~15 sec with ~8 sec latency    Dix-Hallpike Left Symptoms --   Unable to visualize; pt reports symptoms     Sidelying Right   Sidelying Right Duration 0    Sidelying Right Symptoms No nystagmus   sidelying Dix-Hallpike; no symptoms after performing Epley     Sidelying Left   Sidelying Left Duration <10 sec    Sidelying Left Symptoms No nystagmus   Unable to visualize, pt reporting mild symptoms of movement     Horizontal Canal Right   Horizontal Canal Right Duration <10 sec    Horizontal Canal Right Symptoms --   Unable to visualize; however, pt reports mild symptoms of movement. Did not resolve after performing gufoni/cassani     Horizontal Canal Left   Horizontal Canal Left Duration <15 sec    Horizontal Canal Left Symptoms --   Unable to visualize; however, pt reports mild symptoms of movement               Objective measurements completed on examination: See above findings.        Vestibular Treatment/Exercise - 10/28/21 0001       Vestibular Treatment/Exercise   Vestibular Treatment Provided Canalith Repositioning    Canalith Repositioning Epley Manuever Right;Epley Manuever Left;Appiani Right;Appiani Left       EPLEY MANUEVER RIGHT   Number of Reps  1    Overall Response Symptoms Resolved    Response Details   rechecked in  Dix-Hallpike sidelying       EPLEY MANUEVER LEFT   Number of Reps  1    Overall Response  Improved Symptoms     RESPONSE DETAILS LEFT after 1 Epley, some mild symptoms persisted with sidelying Dix-Hallpike      Appiani Right   Number of Reps  2    Overall Response  No Change    Response Details  Performed prior to L horizontal canalith testing      Appiani Left   Number of Reps  1    Overall Response  Improved Symptoms                    PT Education - 10/28/21 0813     Education Details Discussed exam findings, POC and HEP    Person(s) Educated Patient    Methods Explanation;Demonstration;Tactile cues;Verbal cues;Handout    Comprehension Verbalized understanding;Returned demonstration;Verbal cues required;Tactile cues required                 PT Long Term Goals - 10/28/21 0821       PT LONG TERM GOAL #1   Title Pt will be independent with HEP    Time 6    Period Weeks    Status New    Target Date 12/09/21      PT LONG TERM GOAL #2   Title Pt will report no more dizziness in all canalith positions to demo resolution of BPPV    Time 6    Period Weeks    Status New    Target Date 12/09/21      PT LONG TERM GOAL #3   Title Pt will be able to perform all VOR and gaze stabilization tasks without illiciting dizziness to demo improved vestibular function    Time 6    Period Weeks    Status New    Target Date 12/09/21      PT LONG TERM GOAL #4   Title Pt will report >/=50% improvement with neck pain    Time 6    Period Weeks    Status New    Target Date 12/09/21                    Plan - 10/28/21 0813     Clinical Impression Statement Ms. Tajai Suder is a 66 y/o F presenting to OPPT due to complaints of increasing neck pain and ongoing dizziness since [facility] Day. Began as a spinning sensation upon waking. She states it now currently feels like her head "feels funny" and disconnected. Assessment significant for difficulty  maintaining gaze stabilization with head turns and s/s consistent with likely BPPV in bilat posterior canaliths and L>R horizontal canalith. No overt nystagmus seen; however, pt reports sensations of increased movement. Unable to visualize geotrophic vs apogeotropic nystagmus with L&R horizontal canalith testing therefore both ears treated this session. Pt would benefit from weekly follow up with PT to fully resolve her vestibulopathy and neck pain. Will further assess neck pain on next session.    Personal Factors and Comorbidities Age;Time since onset of injury/illness/exacerbation    Examination-Activity Limitations Bed Mobility;Stand;Locomotion Level    Examination-Participation Restrictions Community Activity;Driving    Stability/Clinical Decision Making Evolving/Moderate complexity    Clinical Decision Making Moderate    Rehab Potential Good    PT Frequency 1x / week    PT Duration 6 weeks    PT Treatment/Interventions ADLs/Self Care Home Management;Cryotherapy;Electrical Stimulation;Iontophoresis 4mg /ml Dexamethasone;Moist Heat;Functional mobility training;Therapeutic  activities;Therapeutic exercise;Balance training;Neuromuscular re-education;Passive range of motion;Dry needling;Taping;Manual techniques    PT Next Visit Plan Assess response to canalith repositioning and treat accordingly. Initiate gaze stabilization. Evaluate neck    PT Home Exercise Plan Will provide next visit.    Consulted and Agree with Plan of Care Patient             Patient will benefit from skilled therapeutic intervention in order to improve the following deficits and impairments:  Difficulty walking, Dizziness, Increased fascial restricitons, Increased muscle spasms, Pain, Decreased mobility, Postural dysfunction, Decreased strength  Visit Diagnosis: Dizziness and giddiness  Cervicalgia  Abnormal posture     Problem List Patient Active Problem List   Diagnosis Date Noted   GERD (gastroesophageal  reflux disease) 05/19/2021   Osteoarthritis 05/19/2021   Osteoarthritis of left knee 06/14/2017   Cervical stenosis (uterine cervix) 12/14/2016   Multinodular goiter 09/04/2013   Right ankle swelling 09/04/2013   Morbid obesity (HCC) 03/14/2012   OSA (obstructive sleep apnea) 10/27/2010   IFG (impaired fasting glucose) 10/27/2010   CAROTID STENOSIS 09/22/2009   Hyperlipidemia 03/27/2009   Depression, major, recurrent (HCC) 03/27/2009   HYPERTENSION, BENIGN 03/27/2009   ALLERGIC RHINITIS 03/27/2009    Kassidi Elza April Dell Ponto, PT, DPT 10/28/2021, 8:24 AM  [facility] [address] [place], [place], [address] Phone: [phone]   Fax:  [phone]  Name: Andee Chivers MRN: [id number] Date of Birth: 05-28-1955

## 2021-11-09 ENCOUNTER — Ambulatory Visit: Payer: Medicare Other | Admitting: Physical Therapy

## 2021-11-09 DIAGNOSIS — M542 Cervicalgia: Secondary | ICD-10-CM

## 2021-11-09 DIAGNOSIS — R293 Abnormal posture: Secondary | ICD-10-CM

## 2021-11-09 DIAGNOSIS — R42 Dizziness and giddiness: Secondary | ICD-10-CM | POA: Diagnosis not present

## 2021-11-11 ENCOUNTER — Ambulatory Visit (INDEPENDENT_AMBULATORY_CARE_PROVIDER_SITE_OTHER): Payer: Medicare Other

## 2021-11-11 DIAGNOSIS — Z1231 Encounter for screening mammogram for malignant neoplasm of breast: Secondary | ICD-10-CM

## 2021-11-12 NOTE — Progress Notes (Signed)
Please call patient. Normal mammogram.  Repeat in 1 year.  

## 2021-11-17 ENCOUNTER — Ambulatory Visit: Payer: Medicare Other | Attending: Family Medicine | Admitting: Physical Therapy

## 2021-11-17 DIAGNOSIS — R42 Dizziness and giddiness: Secondary | ICD-10-CM | POA: Diagnosis present

## 2021-11-17 DIAGNOSIS — M542 Cervicalgia: Secondary | ICD-10-CM | POA: Diagnosis present

## 2021-11-17 DIAGNOSIS — R293 Abnormal posture: Secondary | ICD-10-CM | POA: Diagnosis present

## 2021-11-17 NOTE — Therapy (Addendum)
[place], [place], [address] Phone: [phone]   Fax:  [phone]  Physical Therapy Treatment and Discharge  Patient Details  Name: Julia Woods MRN: [id number] Date of Birth: Nov 11, 1955 Referring Provider (PT): Hali Marry, MD  PHYSICAL THERAPY DISCHARGE SUMMARY  Visits from Start of Care: 2  Current functional level related to goals / functional outcomes: See below   Remaining deficits: See below. Dizziness has improved but some continued neck pain.    Education / Equipment: See below   Patient agrees to discharge. Patient goals were partially met. Patient is being discharged due to not returning since the last visit.   Encounter Date: 11/17/2021   PT End of Session - 11/17/21 1140     Visit Number 3    Number of Visits 12    Date for PT Re-Evaluation 12/08/21    Authorization Type Medicare    PT Start Time 1101    PT Stop Time 1145    PT Time Calculation (min) 44 min    Activity Tolerance Patient tolerated treatment well    Behavior During Therapy WFL for tasks assessed/performed             Past Medical History:  Diagnosis Date   Allergy    allergic rhinitis   Arthritis    Cataract    Depression    GERD (gastroesophageal reflux disease)    Hyperlipidemia    Hypertension    Lumbar herniated disc 06/16/2012   injection, [facility]   Menopausal state    Mental disorder    depression   S/P breast biopsy 1990, 12-2007   benign/Dr. Donne Hazel at [facility]   Sleep apnea     Past Surgical History:  Procedure Laterality Date   ANKLE FRACTURE SURGERY  10/2004   does have hardware/RT   BREAST BIOPSY  1990   8/09 benign/Dr Donne Hazel @ [facility]   BREAST EXCISIONAL BIOPSY Left    FOOT FRACTURE SURGERY  11/2001   RT   FRACTURE SURGERY  2003,2005   Rt. Foot, Rt ankle   JOINT REPLACEMENT  2011   Rt. Knee   KNEE ARTHROSCOPY  07/2008   RT/Dr. Maureen Ralphs   REPLACEMENT TOTAL KNEE   03/2010   RT/Dr. Maureen Ralphs   STEROID INJECTION TO SCAR     lumbar spine    There were no vitals filed for this visit.   Subjective Assessment - 11/17/21 1102     Subjective Pt states vertigo has gone away. Neck pain remains present. Brother passed suddenly -- was not able to do HEP.    Patient is accompained by: Family member   grand daughter   Limitations Standing;Walking    How long can you sit comfortably? n/a    How long can you stand comfortably? n/a    How long can you walk comfortably? n/a    Patient Stated Goals Improve dizziness and neck pain    Currently in Pain? Yes    Pain Score 3     Pain Location Neck                               [facility] Adult PT Treatment/Exercise - 11/17/21 0001       Neck Exercises: Seated   Neck Retraction 10 reps;3 secs    Shoulder Rolls 10 reps;Backwards;Forwards    Other Seated Exercise scapular retraction x10    Other Seated Exercise shoulder  ER, "W", red tband 2x10      Neck Exercises: Stretches   Upper Trapezius Stretch Right;Left;30 seconds    Levator Stretch Right;30 seconds    Other Neck Stretches SCM stretch x30 sec      Shoulder Exercises: Standing   Extension Strengthening;Both;20 reps;Theraband    Theraband Level (Shoulder Extension) Level 3 (Green)    Row Strengthening;Both;20 reps    Theraband Level (Shoulder Row) Level 3 (Green)      Manual Therapy   Manual Therapy Soft tissue mobilization    Manual therapy comments skilled assessment and palpation for TPDN    Soft tissue mobilization pin & stretch, STM & TPR UTs and levators              Trigger Point Dry Needling - 11/17/21 0001     Consent Given? Yes    Education Handout Provided Yes    Muscles Treated Head and Neck Upper trapezius    Upper Trapezius Response Twitch reponse elicited;Palpable increased muscle length   bilat                       PT Long Term Goals - 10/28/21 0821       PT LONG TERM GOAL #1   Title Pt  will be independent with HEP    Time 6    Period Weeks    Status New    Target Date 12/09/21      PT LONG TERM GOAL #2   Title Pt will report no more dizziness in all canalith positions to demo resolution of BPPV    Time 6    Period Weeks    Status New    Target Date 12/09/21      PT LONG TERM GOAL #3   Title Pt will be able to perform all VOR and gaze stabilization tasks without illiciting dizziness to demo improved vestibular function    Time 6    Period Weeks    Status New    Target Date 12/09/21      PT LONG TERM GOAL #4   Title Pt will report >/=50% improvement with neck pain    Time 6    Period Weeks    Status New    Target Date 12/09/21                   Plan - 11/17/21 1104     Clinical Impression Statement Trial of TPDN to address UT tension. Vertigo is better controlled. Session focused primarily on scapular/postural stabilization and manual work.    Personal Factors and Comorbidities Age;Time since onset of injury/illness/exacerbation    Examination-Activity Limitations Bed Mobility;Stand;Locomotion Level    Examination-Participation Restrictions Community Activity;Driving    Stability/Clinical Decision Making Evolving/Moderate complexity    Rehab Potential Good    PT Frequency 1x / week    PT Duration 6 weeks    PT Treatment/Interventions ADLs/Self Care Home Management;Cryotherapy;Electrical Stimulation;Iontophoresis 7m/ml Dexamethasone;Moist Heat;Functional mobility training;Therapeutic activities;Therapeutic exercise;Balance training;Neuromuscular re-education;Passive range of motion;Dry needling;Taping;Manual techniques    PT Next Visit Plan Assess response to canalith repositioning and treat accordingly. Initiate gaze stabilization. Evaluate neck    PT Home Exercise Plan Will provide next visit.    Consulted and Agree with Plan of Care Patient             Patient will benefit from skilled therapeutic intervention in order to improve the  following deficits and impairments:  Difficulty walking, Dizziness, Increased fascial restricitons, Increased  muscle spasms, Pain, Decreased mobility, Postural dysfunction, Decreased strength  Visit Diagnosis: Dizziness and giddiness  Cervicalgia  Abnormal posture     Problem List Patient Active Problem List   Diagnosis Date Noted   GERD (gastroesophageal reflux disease) 05/19/2021   Osteoarthritis 05/19/2021   Osteoarthritis of left knee 06/14/2017   Cervical stenosis (uterine cervix) 12/14/2016   Multinodular goiter 09/04/2013   Right ankle swelling 09/04/2013   Morbid obesity ([place]) 03/14/2012   OSA (obstructive sleep apnea) 10/27/2010   IFG (impaired fasting glucose) 10/27/2010   CAROTID STENOSIS 09/22/2009   Hyperlipidemia 03/27/2009   Depression, major, recurrent ([place]) 03/27/2009   HYPERTENSION, BENIGN 03/27/2009   ALLERGIC RHINITIS 03/27/2009    Joi Leyva April Gordy Levan, PT, DPT 11/17/2021, 11:48 AM  [facility] [place] [address] [place], [place], [address] Phone: [phone]   Fax:  [phone]  Name: Julia Woods MRN: [id number] Date of Birth: 31-Oct-1955

## 2021-11-25 ENCOUNTER — Ambulatory Visit (INDEPENDENT_AMBULATORY_CARE_PROVIDER_SITE_OTHER): Payer: Medicare Other | Admitting: Family Medicine

## 2021-11-25 ENCOUNTER — Encounter: Payer: Self-pay | Admitting: Family Medicine

## 2021-11-25 VITALS — BP 129/69 | HR 92 | Ht 64.0 in | Wt 286.0 lb

## 2021-11-25 DIAGNOSIS — R5383 Other fatigue: Secondary | ICD-10-CM

## 2021-11-25 DIAGNOSIS — I671 Cerebral aneurysm, nonruptured: Secondary | ICD-10-CM

## 2021-11-25 DIAGNOSIS — R7301 Impaired fasting glucose: Secondary | ICD-10-CM

## 2021-11-25 DIAGNOSIS — F4321 Adjustment disorder with depressed mood: Secondary | ICD-10-CM

## 2021-11-25 DIAGNOSIS — I10 Essential (primary) hypertension: Secondary | ICD-10-CM | POA: Diagnosis not present

## 2021-11-25 DIAGNOSIS — R519 Headache, unspecified: Secondary | ICD-10-CM

## 2021-11-25 LAB — POCT GLYCOSYLATED HEMOGLOBIN (HGB A1C): Hemoglobin A1C: 5.4 % (ref 4.0–5.6)

## 2021-11-25 NOTE — Assessment & Plan Note (Signed)
Well controlled. Continue current regimen. Follow up in  6 months.  

## 2021-11-25 NOTE — Assessment & Plan Note (Signed)
CT a that was done at [facility]  They had noted that she had a tiny 1point millimeter aneurysm projecting laterally from the left cavernous ICA.  They did not recommend any specific follow-up.  We will see when we need to repeat a possible MRA for follow-up.

## 2021-11-25 NOTE — Progress Notes (Signed)
Established Patient Office Visit  Subjective   Patient ID: Julia Woods, female    DOB: [birth year]  Age: 66 y.o. MRN: [id number]  Chief Complaint  Patient presents with   Hypertension    HPI  Hypertension- Pt denies chest pain, SOB, dizziness, or heart palpitations.  Taking meds as directed w/o problems.  Denies medication side effects.    Impaired fasting glucose-no increased thirst or urination. No symptoms consistent with hypoglycemia.  Since Monday has had a HA and felt tired.  No fever or chills or URI sxs.  No ear pain or pressure.  She says the headache tends to start later in the day she does not wake up with it.  It feels like more of a dull ache.  She is also had some cough.  Tylenol helps some.  She also wanted to follow-up on the aneurysm that was seen on the CT a that was done at [facility].  They had noted that she had a tiny 1point millimeter aneurysm projecting laterally from the left cavernous ICA.  They did not recommend any specific follow-up.  Her only brother passed away about 2 weeks ago suddenly.  He was on blood thinners and had A-fib.  It has been difficult.    ROS    Objective:     BP 129/69   Pulse 92   Ht 5\' 4"  (1.626 m)   Wt 286 lb (129.7 kg)   LMP 11/13/2008   SpO2 94%   BMI 49.09 kg/m    Physical Exam Vitals and nursing note reviewed.  Constitutional:      Appearance: She is well-developed.  HENT:     Head: Normocephalic and atraumatic.     Right Ear: External ear normal.     Left Ear: External ear normal.     Nose: Nose normal.  Eyes:     Conjunctiva/sclera: Conjunctivae normal.     Pupils: Pupils are equal, round, and reactive to light.  Neck:     Thyroid: No thyromegaly.  Cardiovascular:     Rate and Rhythm: Normal rate and regular rhythm.     Heart sounds: Normal heart sounds.  Pulmonary:     Effort: Pulmonary effort is normal.     Breath sounds: Normal breath sounds. No wheezing.  Musculoskeletal:      Cervical back: Neck supple.  Lymphadenopathy:     Cervical: No cervical adenopathy.  Skin:    General: Skin is warm and dry.  Neurological:     Mental Status: She is alert and oriented to person, place, and time.  Psychiatric:        Behavior: Behavior normal.      Results for orders placed or performed in visit on 11/25/21  POCT glycosylated hemoglobin (Hb A1C)  Result Value Ref Range   Hemoglobin A1C 5.4 4.0 - 5.6 %   HbA1c POC (<> result, manual entry)     HbA1c, POC (prediabetic range)     HbA1c, POC (controlled diabetic range)        The 10-year ASCVD risk score (Arnett DK, et al., 2019) is: 15.2%    Assessment & Plan:   Problem List Items Addressed This Visit       Cardiovascular and Mediastinum   Internal carotid aneurysm, LEFT    CT a that was done at [facility].  They had noted that she had a tiny 1point millimeter aneurysm projecting laterally from the left cavernous ICA.  They did not recommend  any specific follow-up.  We will see when we need to repeat a possible MRA for follow-up.      HYPERTENSION, BENIGN - Primary    Well controlled. Continue current regimen. Follow up in  6 months.        Relevant Orders   Urine Microalbumin w/creat. ratio   Lipid Panel w/reflex Direct LDL   COMPLETE METABOLIC PANEL WITH GFR   CBC   TSH     Endocrine   IFG (impaired fasting glucose)    A1c looks great today at 5.4.  In fact its improved from previous.  She is currently engaged in a fit program through her orthopedic office and they are working on setting some nutrition and exercise goals.      Relevant Orders   POCT glycosylated hemoglobin (Hb A1C) (Completed)   Urine Microalbumin w/creat. ratio   Lipid Panel w/reflex Direct LDL   COMPLETE METABOLIC PANEL WITH GFR   CBC   TSH   Other Visit Diagnoses     Other fatigue       Relevant Orders   Urine Microalbumin w/creat. ratio   Lipid Panel w/reflex Direct LDL   COMPLETE  METABOLIC PANEL WITH GFR   CBC   TSH   Frontal headache       Relevant Orders   Urine Microalbumin w/creat. ratio   Lipid Panel w/reflex Direct LDL   COMPLETE METABOLIC PANEL WITH GFR   CBC   TSH   Grief           Grief -Dealing with her brother's loss right now.  Stable on current medication regimen.  Just encouraged her to reach out if at any point she feels like talking with someone would be helpful.  Frontal headaches with fatigue-unclear etiology at this point.  No red flag symptoms such as neurological changes.  We will get some labs just to rule out thyroid disorder, anemia, electrolyte disturbance.  We will check to see if the white blood cells are elevated to indicate a possible infection.  Return in about 6 months (around 05/28/2022) for Hypertension.    Nani Gasser, MD

## 2021-11-25 NOTE — Assessment & Plan Note (Signed)
A1c looks great today at 5.4.  In fact its improved from previous.  She is currently engaged in a fit program through her orthopedic office and they are working on setting some nutrition and exercise goals.

## 2021-11-26 ENCOUNTER — Other Ambulatory Visit: Payer: Self-pay | Admitting: Neurology

## 2021-11-26 DIAGNOSIS — R748 Abnormal levels of other serum enzymes: Secondary | ICD-10-CM

## 2021-11-26 DIAGNOSIS — D582 Other hemoglobinopathies: Secondary | ICD-10-CM

## 2021-11-26 LAB — COMPLETE METABOLIC PANEL WITH GFR
AG Ratio: 1.6 (calc) (ref 1.0–2.5)
ALT: 39 U/L — ABNORMAL HIGH (ref 6–29)
AST: 30 U/L (ref 10–35)
Albumin: 4.5 g/dL (ref 3.6–5.1)
Alkaline phosphatase (APISO): 98 U/L (ref 37–153)
BUN: 25 mg/dL (ref 7–25)
CO2: 25 mmol/L (ref 20–32)
Calcium: 9.8 mg/dL (ref 8.6–10.4)
Chloride: 100 mmol/L (ref 98–110)
Creat: 0.89 mg/dL (ref 0.50–1.05)
Globulin: 2.8 g/dL (calc) (ref 1.9–3.7)
Glucose, Bld: 109 mg/dL — ABNORMAL HIGH (ref 65–99)
Potassium: 3.8 mmol/L (ref 3.5–5.3)
Sodium: 138 mmol/L (ref 135–146)
Total Bilirubin: 1.1 mg/dL (ref 0.2–1.2)
Total Protein: 7.3 g/dL (ref 6.1–8.1)
eGFR: 71 mL/min/{1.73_m2} (ref >=60)

## 2021-11-26 LAB — LIPID PANEL W/REFLEX DIRECT LDL
Cholesterol: 207 mg/dL — ABNORMAL HIGH (ref <200)
HDL: 88 mg/dL (ref >=50)
LDL Cholesterol (Calc): 99 mg/dL (calc)
Non-HDL Cholesterol (Calc): 119 mg/dL (calc) (ref <130)
Total CHOL/HDL Ratio: 2.4 (calc) (ref <5.0)
Triglycerides: 104 mg/dL (ref <150)

## 2021-11-26 LAB — CBC
HCT: 47.2 % — ABNORMAL HIGH (ref 35.0–45.0)
Hemoglobin: 16 g/dL — ABNORMAL HIGH (ref 11.7–15.5)
MCH: 30.4 pg (ref 27.0–33.0)
MCHC: 33.9 g/dL (ref 32.0–36.0)
MCV: 89.6 fL (ref 80.0–100.0)
MPV: 10.1 fL (ref 7.5–12.5)
Platelets: 304 10*3/uL (ref 140–400)
RBC: 5.27 10*6/uL — ABNORMAL HIGH (ref 3.80–5.10)
RDW: 13 % (ref 11.0–15.0)
WBC: 6.7 10*3/uL (ref 3.8–10.8)

## 2021-11-26 LAB — MICROALBUMIN / CREATININE URINE RATIO
Creatinine, Urine: 164 mg/dL (ref 20–275)
Microalb Creat Ratio: 29 mcg/mg creat (ref <30)
Microalb, Ur: 4.8 mg/dL

## 2021-11-26 LAB — TSH: TSH: 2.33 mIU/L (ref 0.40–4.50)

## 2021-11-26 NOTE — Progress Notes (Signed)
Hi Colie, LDL cholesterol just under 100 which is great.  Overall metabolic panel looks good except 1 liver enzyme is just slightly elevated.  Not in a worrisome range, but we do want to keep an eye on it and make sure that it is not a trend.  Plan to recheck hepatic function in 1 month.  Hemoglobin was slightly elevated at 16.  Just make sure you are hydrating well.  We can plan to recheck a CBC in 1 month as well.  No excess protein in the urine which is reassuring.  Thyroid level looks great.

## 2021-12-11 ENCOUNTER — Other Ambulatory Visit: Payer: Self-pay | Admitting: Family Medicine

## 2021-12-11 DIAGNOSIS — I10 Essential (primary) hypertension: Secondary | ICD-10-CM

## 2021-12-11 DIAGNOSIS — F339 Major depressive disorder, recurrent, unspecified: Secondary | ICD-10-CM

## 2021-12-30 ENCOUNTER — Encounter: Payer: Self-pay | Admitting: Family Medicine

## 2021-12-31 ENCOUNTER — Other Ambulatory Visit: Payer: Self-pay | Admitting: *Deleted

## 2021-12-31 DIAGNOSIS — I671 Cerebral aneurysm, nonruptured: Secondary | ICD-10-CM

## 2022-01-05 ENCOUNTER — Encounter: Payer: Self-pay | Admitting: General Practice

## 2022-01-10 NOTE — Progress Notes (Unsigned)
[facility] [place]  ASSESSMENT / PLAN: Julia Woods is a 66 y.o. female with submillimeter intracranial carotid artery abnormality, read as a possible aneurysm in [place], [place] on CT angiogram done for vertiginous dizziness.  She has no hemodynamically significant carotid atherosclerosis.  She does have some tortuosity in her internal carotid arteries near the skull base.  I counseled her about the benign nature of these findings, encouraged her to follow-up with a neurosurgeon to discuss the intracranial disease.  I suspect this will not require any kind of intervention based on its size.  CHIEF COMPLAINT: Carotid aneurysm  HISTORY OF PRESENT ILLNESS: Julia Woods is a 66 y.o. female referred to clinic for evaluation of carotid abnormality identified on CT angiogram.  The patient was visiting her son in [place] when she developed dizziness.  She presented to an urgent care center where she was found to be diaphoretic and tachycardic.  She was referred to the closest ER because of concern for possible heart attack.  Further work-up was performed.  This included a CT angiogram.  The CT angiogram revealed a millimeter abnormality in the intracranial left carotid artery.  The patient reviewed these findings with her primary care physician, who recommended she follow-up with vascular specialist.  Past Medical History:  Diagnosis Date   Allergy    allergic rhinitis   Arthritis    Cataract    Depression    GERD (gastroesophageal reflux disease)    Hyperlipidemia    Hypertension    Lumbar herniated disc 06/16/2012   injection, [facility]   Menopausal state    Mental disorder    depression   S/P breast biopsy 1990, 12-2007   benign/Dr. Dwain Sarna at [facility]   Sleep apnea     Past Surgical History:  Procedure Laterality Date   ANKLE FRACTURE SURGERY  10/2004   does have hardware/RT   BREAST BIOPSY  1990   8/09 benign/Dr Dwain Sarna @ [facility]   BREAST EXCISIONAL BIOPSY  Left    FOOT FRACTURE SURGERY  11/2001   RT   FRACTURE SURGERY  2003,2005   Rt. Foot, Rt ankle   JOINT REPLACEMENT  2011   Rt. Knee   KNEE ARTHROSCOPY  07/2008   RT/Dr. Despina Hick   REPLACEMENT TOTAL KNEE  03/2010   RT/Dr. Despina Hick   STEROID INJECTION TO SCAR     lumbar spine    Family History  Problem Relation Age of Onset   Cancer Mother 54       colon   Diabetes Mother    Stroke Mother    Heart attack Mother    Hyperlipidemia Mother    Hypertension Mother    Arthritis Mother    Heart attack Father 58   Early death Father    Heart disease Father    Hyperlipidemia Father    Hyperlipidemia Other        family history   Hypertension Other        family history   Stroke Other        1st degree relative < 50   Stroke Brother     Social History   Socioeconomic History   Marital status: Married    Spouse name: Harvie Heck   Number of children: 2   Years of education: 13   Highest education level: Some college, no degree  Occupational History    Comment: Retired  Tobacco Use   Smoking status: Never   Smokeless tobacco: Never  Substance and Sexual Activity  Alcohol use: Not Currently    Alcohol/week: 1.0 - 2.0 standard drink of alcohol    Types: 1 - 2 Glasses of wine per week    Comment: occassionally   Drug use: No   Sexual activity: Not Currently    Birth control/protection: Post-menopausal  Other Topics Concern   Not on file  Social History Narrative   Lives with her spouse. She enjoys swimming, reading and crotcheing.    Social Determinants of Health   Financial Resource Strain: Low Risk  (12/02/2020)   Overall Financial Resource Strain (CARDIA)    Difficulty of Paying Living Expenses: Not hard at all  Food Insecurity: No Food Insecurity (12/02/2020)   Hunger Vital Sign    Worried About Running Out of Food in the Last Year: Never true    Ran Out of Food in the Last Year: Never true  Transportation Needs: No Transportation Needs (12/02/2020)   PRAPARE -  Hydrologist (Medical): No    Lack of Transportation (Non-Medical): No  Physical Activity: Sufficiently Active (12/02/2020)   Exercise Vital Sign    Days of Exercise per Week: 3 days    Minutes of Exercise per Session: 60 min  Stress: No Stress Concern Present (12/02/2020)   [place]    Feeling of Stress : Not at all  Social Connections: Moderately Isolated (12/02/2020)   Social Connection and Isolation Panel [NHANES]    Frequency of Communication with Friends and Family: More than three times a week    Frequency of Social Gatherings with Friends and Family: More than three times a week    Attends Religious Services: Never    Marine scientist or Organizations: No    Attends Archivist Meetings: Never    Marital Status: Married  Human resources officer Violence: Not At Risk (12/02/2020)   Humiliation, Afraid, Rape, and Kick questionnaire    Fear of Current or Ex-Partner: No    Emotionally Abused: No    Physically Abused: No    Sexually Abused: No    Allergies  Allergen Reactions   Penicillins Hives   Wound Dressing Adhesive Other (See Comments)    Bruising     Current Outpatient Medications  Medication Sig Dispense Refill   AMBULATORY NON FORMULARY MEDICATION Medication Name:  CPAP machine including tubing, filter, humidifier, mask. See sleep study included w/order  Dx OSA - G47.33  FAX:[phone] 1 Units 0   atorvastatin (LIPITOR) 40 MG tablet TAKE 1 TABLET AT BEDTIME 90 tablet 1   Cholecalciferol (VITAMIN D) 2000 units CAPS Take 1 capsule by mouth daily.     esomeprazole (NEXIUM) 20 MG capsule Take 20 mg by mouth. Every other day     losartan-hydrochlorothiazide (HYZAAR) 100-25 MG tablet TAKE 1 TABLET DAILY 90 tablet 1   meclizine (ANTIVERT) 12.5 MG tablet Take 12.5 mg by mouth 3 (three) times daily as needed.     meloxicam (MOBIC) 7.5 MG tablet Take 1 tablet (7.5 mg  total) by mouth daily. 90 tablet 3   metoprolol succinate (TOPROL-XL) 25 MG 24 hr tablet TAKE 1 TABLET DAILY 90 tablet 1   Multiple Vitamin (MULTIVITAMIN) tablet Take 1 tablet by mouth daily.       valACYclovir (VALTREX) 1000 MG tablet TAKE 1 TABLET BY MOUTH  TWICE DAILY AS NEEDED 180 tablet 3   venlafaxine XR (EFFEXOR-XR) 150 MG 24 hr capsule TAKE 1 CAPSULE DAILY 90 capsule 1   No  current facility-administered medications for this visit.    PHYSICAL EXAM Vitals:   01/11/22 1456 01/11/22 1459  BP: 135/82 125/81  Pulse: 86   Resp: 20   Temp: 98.5 F (36.9 C)   SpO2: 93%   Weight: 289 lb (131.1 kg)   Height: 5\' 4"  (1.626 m)     Well-appearing elderly woman in no acute distress Regular rate and rhythm Unlabored breathing No focal neurologic abnormalities   PERTINENT LABORATORY AND RADIOLOGIC DATA  Most recent CBC    Latest Ref Rng & Units 11/25/2021   11:09 AM 01/06/2020   11:57 AM 04/30/2019   10:14 AM  CBC  WBC 3.8 - 10.8 Thousand/uL 6.7  6.8  7.3   Hemoglobin 11.7 - 15.5 g/dL 05/02/2019  29.5  28.4   Hematocrit 35.0 - 45.0 % 47.2  40.8  45.7   Platelets 140 - 400 Thousand/uL 304  264  284      Most recent CMP    Latest Ref Rng & Units 11/25/2021   11:09 AM 05/19/2021   12:00 AM 11/10/2020   10:56 AM  CMP  Glucose 65 - 99 mg/dL 11/12/2020  440  98   BUN 7 - 25 mg/dL 25  21  28    Creatinine 0.50 - 1.05 mg/dL 102   7.25   Sodium 135 - 146 mmol/L 138  140  139   Potassium 3.5 - 5.3 mmol/L 3.8  4.5  4.3   Chloride 98 - 110 mmol/L 100  103  101   CO2 20 - 32 mmol/L 25  26  32   Calcium 8.6 - 10.4 mg/dL 9.8  9.8  3.66   Total Protein 6.1 - 8.1 g/dL 7.3   6.9   Total Bilirubin 0.2 - 1.2 mg/dL 1.1   0.9   AST 10 - 35 U/L 30   20   ALT 6 - 29 U/L 39   18     Renal function CrCl cannot be calculated (Patient's most recent lab result is older than the maximum 21 days allowed.).  Hemoglobin A1C (%)  Date Value  11/25/2021 5.4   Hgb A1c MFr Bld (%)  Date Value   09/20/2013 5.9 (H)    LDL Cholesterol (Calc)  Date Value Ref Range Status  11/25/2021 99 mg/dL (calc) Final    Comment:    Reference range: <100 . Desirable range <100 mg/dL for primary prevention;   <70 mg/dL for patients with CHD or diabetic patients  with > or = 2 CHD risk factors. 11/20/2013 LDL-C is now calculated using the Martin-Hopkins  calculation, which is a validated novel method providing  better accuracy than the Friedewald equation in the  estimation of LDL-C.  11/27/2021 et al. Marland Kitchen. Horald Pollen): 2061-2068  (http://education.[facility].com/faq/FAQ164)     Outside CT angiogram reviewed in detail.  A small irregularity on the left intracranial carotid artery is seen.  This may represent an early aneurysm.  It is very subtle.  There is no cervical carotid artery disease beyond tortuosity.  [id number]. 01-23-1973, MD [facility] Phone Number: [phone] 01/10/2022 3:47 PM  Total time spent on preparing this encounter including chart review, data review, collecting history, examining the patient, coordinating care for this new patient, 45 minutes.  Portions of this report may have been transcribed using voice recognition software.  Every effort has been made to ensure accuracy; however, inadvertent computerized transcription errors may still be present.

## 2022-01-11 ENCOUNTER — Ambulatory Visit (INDEPENDENT_AMBULATORY_CARE_PROVIDER_SITE_OTHER): Payer: Medicare Other | Admitting: Vascular Surgery

## 2022-01-11 ENCOUNTER — Encounter: Payer: Self-pay | Admitting: Vascular Surgery

## 2022-01-11 ENCOUNTER — Ambulatory Visit (HOSPITAL_COMMUNITY)
Admission: RE | Admit: 2022-01-11 | Discharge: 2022-01-11 | Disposition: A | Discharge status: Home / Self Care | Payer: Medicare Other | Source: Ambulatory Visit | Attending: Vascular Surgery | Admitting: Vascular Surgery

## 2022-01-11 VITALS — BP 125/81 | HR 86 | Temp 98.5°F | Resp 20 | Ht 64.0 in | Wt 289.0 lb

## 2022-01-11 DIAGNOSIS — I671 Cerebral aneurysm, nonruptured: Secondary | ICD-10-CM | POA: Diagnosis present

## 2022-01-19 ENCOUNTER — Encounter: Payer: Self-pay | Admitting: Family Medicine

## 2022-01-19 ENCOUNTER — Ambulatory Visit: Payer: Medicare Other

## 2022-01-19 DIAGNOSIS — I720 Aneurysm of carotid artery: Secondary | ICD-10-CM

## 2022-01-19 LAB — HEPATIC FUNCTION PANEL
AG Ratio: 1.4 (calc) (ref 1.0–2.5)
ALT: 38 U/L — ABNORMAL HIGH (ref 6–29)
AST: 29 U/L (ref 10–35)
Albumin: 4.2 g/dL (ref 3.6–5.1)
Alkaline phosphatase (APISO): 108 U/L (ref 37–153)
Bilirubin, Direct: 0.2 mg/dL (ref 0.0–0.2)
Globulin: 3.1 g/dL (calc) (ref 1.9–3.7)
Indirect Bilirubin: 0.9 mg/dL (calc) (ref 0.2–1.2)
Total Bilirubin: 1.1 mg/dL (ref 0.2–1.2)
Total Protein: 7.3 g/dL (ref 6.1–8.1)

## 2022-01-19 LAB — CBC WITH DIFFERENTIAL/PLATELET
Absolute Monocytes: 536 cells/uL (ref 200–950)
Basophils Absolute: 34 cells/uL (ref 0–200)
Basophils Relative: 0.5 %
Eosinophils Absolute: 194 cells/uL (ref 15–500)
Eosinophils Relative: 2.9 %
HCT: 45.7 % — ABNORMAL HIGH (ref 35.0–45.0)
Hemoglobin: 15.5 g/dL (ref 11.7–15.5)
Lymphs Abs: 1581 cells/uL (ref 850–3900)
MCH: 30.7 pg (ref 27.0–33.0)
MCHC: 33.9 g/dL (ref 32.0–36.0)
MCV: 90.5 fL (ref 80.0–100.0)
MPV: 10.5 fL (ref 7.5–12.5)
Monocytes Relative: 8 %
Neutro Abs: 4355 cells/uL (ref 1500–7800)
Neutrophils Relative %: 65 %
Platelets: 312 10*3/uL (ref 140–400)
RBC: 5.05 10*6/uL (ref 3.80–5.10)
RDW: 13.4 % (ref 11.0–15.0)
Total Lymphocyte: 23.6 %
WBC: 6.7 10*3/uL (ref 3.8–10.8)

## 2022-01-19 NOTE — Progress Notes (Signed)
Hi Terasa, your blood count is normal.  Liver panel looks good overall except the ALT is still just borderline elevated similar to last time it is not trending up which is reassuring.  We will continue to follow it and plan to recheck again in about 4 months.  Continue to work on healthy food choices and regular exercise to reduce liver inflammation.

## 2022-01-21 ENCOUNTER — Encounter: Payer: Self-pay | Admitting: Vascular Surgery

## 2022-05-30 ENCOUNTER — Ambulatory Visit (INDEPENDENT_AMBULATORY_CARE_PROVIDER_SITE_OTHER): Payer: Medicare Other | Admitting: Family Medicine

## 2022-05-30 ENCOUNTER — Encounter: Payer: Self-pay | Admitting: Family Medicine

## 2022-05-30 VITALS — BP 103/59 | HR 72 | Ht 64.0 in | Wt 283.0 lb

## 2022-05-30 DIAGNOSIS — F339 Major depressive disorder, recurrent, unspecified: Secondary | ICD-10-CM | POA: Diagnosis not present

## 2022-05-30 DIAGNOSIS — I10 Essential (primary) hypertension: Secondary | ICD-10-CM | POA: Diagnosis not present

## 2022-05-30 DIAGNOSIS — E785 Hyperlipidemia, unspecified: Secondary | ICD-10-CM

## 2022-05-30 DIAGNOSIS — R7301 Impaired fasting glucose: Secondary | ICD-10-CM | POA: Diagnosis not present

## 2022-05-30 DIAGNOSIS — R748 Abnormal levels of other serum enzymes: Secondary | ICD-10-CM | POA: Diagnosis not present

## 2022-05-30 LAB — POCT GLYCOSYLATED HEMOGLOBIN (HGB A1C): Hemoglobin A1C: 6.5 % — AB (ref 4.0–5.6)

## 2022-05-30 MED ORDER — METOPROLOL SUCCINATE ER 25 MG PO TB24: 25.0000 mg | ORAL_TABLET | Freq: Every day | ORAL | 3 refills | Status: DC

## 2022-05-30 MED ORDER — ATORVASTATIN CALCIUM 40 MG PO TABS: 40.0000 mg | ORAL_TABLET | Freq: Every day | ORAL | 3 refills | Status: DC

## 2022-05-30 MED ORDER — LOSARTAN POTASSIUM-HCTZ 100-25 MG PO TABS: 1.0000 | ORAL_TABLET | Freq: Every day | ORAL | 3 refills | Status: DC

## 2022-05-30 MED ORDER — VENLAFAXINE HCL ER 150 MG PO CP24: 150.0000 mg | ORAL_CAPSULE | Freq: Every day | ORAL | 3 refills | Status: DC

## 2022-05-30 NOTE — Assessment & Plan Note (Signed)
BP up a little bit from her baseline, will recheck before she goes today.

## 2022-05-30 NOTE — Progress Notes (Signed)
Established Patient Office Visit  Subjective   Patient ID: Julia Woods, female    DOB: [birth year]  Age: 67 y.o. MRN: [id number]  Chief Complaint  Patient presents with   Hypertension    HPI  Hypertension- Pt denies chest pain, SOB, dizziness, or heart palpitations.  Taking meds as directed w/o problems.  Denies medication side effects.    She had some aches and chills about a week ago.  Also had some nausea and some loose stools.  That resolved pretty quickly.  Since then though she just felt really tired and had a decreased appetite.  Impaired fasting glucose-no increased thirst or urination. No symptoms consistent with hypoglycemia.  Last A1c was 5.4.        ROS    Objective:     BP (!) 103/59   Pulse 72   Ht 5\' 4"  (1.626 m)   Wt 283 lb (128.4 kg)   LMP 11/13/2008   SpO2 94%   BMI 48.58 kg/m    Physical Exam Vitals and nursing note reviewed.  Constitutional:      Appearance: She is well-developed.  HENT:     Head: Normocephalic and atraumatic.  Cardiovascular:     Rate and Rhythm: Normal rate and regular rhythm.     Heart sounds: Normal heart sounds.  Pulmonary:     Effort: Pulmonary effort is normal.     Breath sounds: Normal breath sounds.  Skin:    General: Skin is warm and dry.  Neurological:     Mental Status: She is alert and oriented to person, place, and time.  Psychiatric:        Behavior: Behavior normal.      Results for orders placed or performed in visit on 05/30/22  POCT glycosylated hemoglobin (Hb A1C)  Result Value Ref Range   Hemoglobin A1C 6.5 (A) 4.0 - 5.6 %   HbA1c POC (<> result, manual entry)     HbA1c, POC (prediabetic range)     HbA1c, POC (controlled diabetic range)        The 10-year ASCVD risk score (Arnett DK, et al., 2019) is: 11.2%    Assessment & Plan:   Problem List Items Addressed This Visit       Cardiovascular and Mediastinum   HYPERTENSION, BENIGN    BP up a little bit from her baseline, will  recheck before she goes today.      Relevant Medications   atorvastatin (LIPITOR) 40 MG tablet   losartan-hydrochlorothiazide (HYZAAR) 100-25 MG tablet   metoprolol succinate (TOPROL-XL) 25 MG 24 hr tablet   Other Relevant Orders   COMPLETE METABOLIC PANEL WITH GFR   CBC with Differential/Platelet     Endocrine   IFG (impaired fasting glucose) - Primary    A1c is also up slightly to 6.5.  Just encouraged her to be on track with her diet and exercise and activity level.  Follow-up in 3 to 4 months.      Relevant Orders   POCT glycosylated hemoglobin (Hb A1C) (Completed)   COMPLETE METABOLIC PANEL WITH GFR   CBC with Differential/Platelet     Other   Hyperlipidemia   Relevant Medications   atorvastatin (LIPITOR) 40 MG tablet   losartan-hydrochlorothiazide (HYZAAR) 100-25 MG tablet   metoprolol succinate (TOPROL-XL) 25 MG 24 hr tablet   Depression, major, recurrent (HCC)   Relevant Medications   venlafaxine XR (EFFEXOR-XR) 150 MG 24 hr capsule   Other Visit Diagnoses     Elevated liver enzymes  Relevant Orders   COMPLETE METABOLIC PANEL WITH GFR   CBC with Differential/Platelet      Elevated liver enzymes-plan to recheck enzymes again today.  Return in about 4 months (around 09/28/2022) for A1C and BP .    Beatrice Lecher, MD

## 2022-05-30 NOTE — Assessment & Plan Note (Signed)
A1c is also up slightly to 6.5.  Just encouraged her to be on track with her diet and exercise and activity level.  Follow-up in 3 to 4 months.

## 2022-05-31 LAB — COMPLETE METABOLIC PANEL WITH GFR
AG Ratio: 1.4 (calc) (ref 1.0–2.5)
ALT: 27 U/L (ref 6–29)
AST: 26 U/L (ref 10–35)
Albumin: 4.1 g/dL (ref 3.6–5.1)
Alkaline phosphatase (APISO): 92 U/L (ref 37–153)
BUN: 22 mg/dL (ref 7–25)
CO2: 28 mmol/L (ref 20–32)
Calcium: 10 mg/dL (ref 8.6–10.4)
Chloride: 101 mmol/L (ref 98–110)
Creat: 1.03 mg/dL (ref 0.50–1.05)
Globulin: 3 g/dL (calc) (ref 1.9–3.7)
Glucose, Bld: 111 mg/dL — ABNORMAL HIGH (ref 65–99)
Potassium: 3.6 mmol/L (ref 3.5–5.3)
Sodium: 141 mmol/L (ref 135–146)
Total Bilirubin: 0.8 mg/dL (ref 0.2–1.2)
Total Protein: 7.1 g/dL (ref 6.1–8.1)
eGFR: 59 mL/min/{1.73_m2} — ABNORMAL LOW (ref >=60)

## 2022-05-31 LAB — CBC WITH DIFFERENTIAL/PLATELET
Absolute Monocytes: 525 cells/uL (ref 200–950)
Basophils Absolute: 28 cells/uL (ref 0–200)
Basophils Relative: 0.4 %
Eosinophils Absolute: 227 cells/uL (ref 15–500)
Eosinophils Relative: 3.2 %
HCT: 44.5 % (ref 35.0–45.0)
Hemoglobin: 15.1 g/dL (ref 11.7–15.5)
Lymphs Abs: 1903 cells/uL (ref 850–3900)
MCH: 30.5 pg (ref 27.0–33.0)
MCHC: 33.9 g/dL (ref 32.0–36.0)
MCV: 89.9 fL (ref 80.0–100.0)
MPV: 10.3 fL (ref 7.5–12.5)
Monocytes Relative: 7.4 %
Neutro Abs: 4416 cells/uL (ref 1500–7800)
Neutrophils Relative %: 62.2 %
Platelets: 329 10*3/uL (ref 140–400)
RBC: 4.95 10*6/uL (ref 3.80–5.10)
RDW: 13.1 % (ref 11.0–15.0)
Total Lymphocyte: 26.8 %
WBC: 7.1 10*3/uL (ref 3.8–10.8)

## 2022-05-31 NOTE — Progress Notes (Signed)
Julia Woods, ALT liver function is back to normal which is great.  Blood count looks great no anemia.  We left to get you scheduled for your Medicare wellness exam with our Medicare wellness nurse.  We can even do it virtually if that works well for you.  Just let us know.

## 2022-06-01 NOTE — Progress Notes (Signed)
Lvm for patient to call and schedule  a medicare AWV for Dr. Madilyn Fireman  with the [facility] Wellness Nurse - as virtual visit. tvt

## 2022-07-05 LAB — HM DIABETES EYE EXAM

## 2022-07-15 ENCOUNTER — Encounter: Payer: Self-pay | Admitting: Family Medicine

## 2022-08-05 ENCOUNTER — Telehealth: Payer: Self-pay | Admitting: Family Medicine

## 2022-08-05 NOTE — Telephone Encounter (Signed)
Called patient to schedule Medicare Annual Wellness Visit (AWV). Left message for patient to call back and schedule Medicare Annual Wellness Visit (AWV).  Last date of AWV: 12/02/2020  Please schedule an appointment at any time with [facility].  If any questions, please contact me at [phone].  Thank you ,  Lin Givens Patient Access Advocate II Direct Dial: [phone]

## 2022-09-28 ENCOUNTER — Ambulatory Visit: Payer: BLUE CROSS/BLUE SHIELD | Admitting: Family Medicine

## 2022-09-30 ENCOUNTER — Other Ambulatory Visit: Payer: Self-pay | Admitting: Family Medicine

## 2022-09-30 DIAGNOSIS — Z780 Asymptomatic menopausal state: Secondary | ICD-10-CM

## 2022-10-18 ENCOUNTER — Encounter: Payer: Self-pay | Admitting: Family Medicine

## 2022-10-18 ENCOUNTER — Ambulatory Visit (INDEPENDENT_AMBULATORY_CARE_PROVIDER_SITE_OTHER): Payer: Medicare Other | Admitting: Family Medicine

## 2022-10-18 VITALS — BP 117/50 | HR 77 | Ht 64.0 in | Wt 280.0 lb

## 2022-10-18 DIAGNOSIS — F330 Major depressive disorder, recurrent, mild: Secondary | ICD-10-CM

## 2022-10-18 DIAGNOSIS — I10 Essential (primary) hypertension: Secondary | ICD-10-CM

## 2022-10-18 DIAGNOSIS — R7301 Impaired fasting glucose: Secondary | ICD-10-CM

## 2022-10-18 DIAGNOSIS — Z7689 Persons encountering health services in other specified circumstances: Secondary | ICD-10-CM | POA: Diagnosis not present

## 2022-10-18 HISTORY — DX: Persons encountering health services in other specified circumstances: Z76.89

## 2022-10-18 LAB — POCT GLYCOSYLATED HEMOGLOBIN (HGB A1C): Hemoglobin A1C: 5.7 % — AB (ref 4.0–5.6)

## 2022-10-18 MED ORDER — VALACYCLOVIR HCL 1 G PO TABS: 1000.0000 mg | ORAL_TABLET | Freq: Two times a day (BID) | ORAL | 3 refills | Status: AC | PRN

## 2022-10-18 MED ORDER — RYBELSUS 3 MG PO TABS
3.0000 mg | ORAL_TABLET | Freq: Every day | ORAL | 0 refills | Status: DC
Start: 2022-10-18 — End: 2022-11-18

## 2022-10-18 NOTE — Assessment & Plan Note (Addendum)
A1c does look better today from previous of 6.5.  She is interested in potentially trying one of the GLP-1's.  Will start with Rybelsus if we cannot get it covered then we can consider switching to metformin.  Visit #: 1 Starting Weight: 280 lbs   Current weight: Previous weight: Change in weight: Goal weight:  Dietary goals: more vegetables nad lean proteins, less carbs and sugars  Exercise goals: Medication: Start Rybelsus 3mg   Follow-up and referrals: 6 weeks.

## 2022-10-18 NOTE — Assessment & Plan Note (Signed)
It is stable.  PHQ-9 and GAD-7 look great today.  Continue current regimen.

## 2022-10-18 NOTE — Progress Notes (Signed)
   Established Patient Office Visit  Subjective   Patient ID: Julia Woods, female    DOB: Oct 11, 1955  Age: 67 y.o. MRN: [id number]  Chief Complaint  Patient presents with   Hypertension   ifg    HPI  Hypertension- Pt denies chest pain, SOB, dizziness, or heart palpitations.  Taking meds as directed w/o problems.  Denies medication side effects.    Impaired fasting glucose-no increased thirst or urination. No symptoms consistent with hypoglycemia.  She has been working with the orthopedist office doing weekly calls to help with weight management.  She has lost about 10 to 15 pounds but just feels like she fluctuates up and down.  She feels like she is struggled with her weight for years.  She has been trying to eat more fresh vegetables and fruits.     ROS    Objective:     BP (!) 117/50   Pulse 77   Ht 5\' 4"  (1.626 m)   Wt 280 lb (127 kg)   LMP 11/13/2008   SpO2 95%   BMI 48.06 kg/m    Physical Exam Vitals and nursing note reviewed.  Constitutional:      Appearance: She is well-developed.  HENT:     Head: Normocephalic and atraumatic.  Cardiovascular:     Rate and Rhythm: Normal rate and regular rhythm.     Heart sounds: Normal heart sounds.  Pulmonary:     Effort: Pulmonary effort is normal.     Breath sounds: Normal breath sounds.  Skin:    General: Skin is warm and dry.  Neurological:     Mental Status: She is alert and oriented to person, place, and time.  Psychiatric:        Behavior: Behavior normal.      Results for orders placed or performed in visit on 10/18/22  POCT glycosylated hemoglobin (Hb A1C)  Result Value Ref Range   Hemoglobin A1C 5.7 (A) 4.0 - 5.6 %   HbA1c POC (<> result, manual entry)     HbA1c, POC (prediabetic range)     HbA1c, POC (controlled diabetic range)        The 10-year ASCVD risk score (Arnett DK, et al., 2019) is: 14.3%    Assessment & Plan:   Problem List Items Addressed This Visit       Cardiovascular and  Mediastinum   HYPERTENSION, BENIGN    Well controlled. Continue current regimen. Follow up in  60mo         Endocrine   IFG (impaired fasting glucose) - Primary    A1c does look better today from previous of 6.5.  She is interested in potentially trying one of the GLP-1's.  Will start with Rybelsus if we cannot get it covered then we can consider switching to metformin.  Visit #: 1 Starting Weight: 280 lbs   Current weight: Previous weight: Change in weight: Goal weight:  Dietary goals: more vegetables nad lean proteins, less carbs and sugars  Exercise goals: Medication: Start Rybelsus 3mg   Follow-up and referrals: 6 weeks.        Relevant Orders   POCT glycosylated hemoglobin (Hb A1C) (Completed)     Other   Encounter for weight management   Depression, major, recurrent (HCC)    It is stable.  PHQ-9 and GAD-7 look great today.  Continue current regimen.       Return in about 4 months (around 02/17/2023) for HTN/IFG.    Nani Gasser, MD

## 2022-10-18 NOTE — Assessment & Plan Note (Signed)
Well controlled. Continue current regimen. Follow up in  4 mo 

## 2022-11-07 ENCOUNTER — Telehealth: Payer: Self-pay

## 2022-11-07 NOTE — Telephone Encounter (Signed)
Initiated Prior authorization ZOX:[id number] 3MG  tablets  Via: Covermymeds Case/Key:[id number] Status: Pending as of 11/03/22 Reason: Notified Pt via: Mychart

## 2022-11-16 ENCOUNTER — Other Ambulatory Visit: Payer: Self-pay | Admitting: Family Medicine

## 2022-11-16 ENCOUNTER — Ambulatory Visit: Payer: Medicare Other

## 2022-11-16 DIAGNOSIS — Z1231 Encounter for screening mammogram for malignant neoplasm of breast: Secondary | ICD-10-CM

## 2022-11-16 DIAGNOSIS — Z780 Asymptomatic menopausal state: Secondary | ICD-10-CM

## 2022-11-18 ENCOUNTER — Other Ambulatory Visit: Payer: Self-pay | Admitting: Family Medicine

## 2022-11-18 NOTE — Progress Notes (Signed)
Please call patient. Normal mammogram.  Repeat in 1 year.  

## 2022-12-17 ENCOUNTER — Other Ambulatory Visit: Payer: Self-pay | Admitting: Family Medicine

## 2022-12-29 ENCOUNTER — Encounter: Payer: Self-pay | Admitting: Family Medicine

## 2022-12-29 ENCOUNTER — Ambulatory Visit (INDEPENDENT_AMBULATORY_CARE_PROVIDER_SITE_OTHER): Payer: Medicare Other | Admitting: Family Medicine

## 2022-12-29 VITALS — BP 108/64 | HR 79 | Ht 64.0 in | Wt 274.0 lb

## 2022-12-29 DIAGNOSIS — K2100 Gastro-esophageal reflux disease with esophagitis, without bleeding: Secondary | ICD-10-CM

## 2022-12-29 DIAGNOSIS — E042 Nontoxic multinodular goiter: Secondary | ICD-10-CM | POA: Diagnosis not present

## 2022-12-29 DIAGNOSIS — Z23 Encounter for immunization: Secondary | ICD-10-CM | POA: Diagnosis not present

## 2022-12-29 DIAGNOSIS — E119 Type 2 diabetes mellitus without complications: Secondary | ICD-10-CM | POA: Diagnosis not present

## 2022-12-29 DIAGNOSIS — I10 Essential (primary) hypertension: Secondary | ICD-10-CM

## 2022-12-29 MED ORDER — RYBELSUS 7 MG PO TABS: 7.0000 mg | ORAL_TABLET | Freq: Every day | ORAL | 1 refills | Status: DC

## 2022-12-29 NOTE — Progress Notes (Signed)
Acute Office Visit  Subjective:     Patient ID: Julia Woods, female    DOB: [birth year], 67 y.o.   MRN: [id number]  Chief Complaint  Patient presents with   Gastroesophageal Reflux    HPI Patient is in today for upset stomach/GERD for couple of weeks. Stopped the Mobic about a week ago  and started TUMs before bedtime to help.  She still takes Nexium 20mg  daily.  She has started making her own cold brew recently but dropped back off on that as well.  She has been feeling a little better the last 2 days.  But at some point the symptoms were severe enough that it was waking her up in the middle the night.  She is also on her second month of Rybelsus and has otherwise been tolerating it well.  No major change in bowels she does have a history of chronic constipation.  No recent abdominal pain.  Hypertension- Pt denies chest pain, SOB, dizziness, or heart palpitations.  Taking meds as directed w/o problems.  Denies medication side effects.     ROS      Objective:    BP 108/64 (BP Location: Left Arm, Cuff Size: Large)   Pulse 79   Ht 5\' 4"  (1.626 m)   Wt 274 lb (124.3 kg)   LMP 11/13/2008   SpO2 95%   BMI 47.03 kg/m    Physical Exam Vitals and nursing note reviewed.  Constitutional:      Appearance: She is well-developed.  HENT:     Head: Normocephalic and atraumatic.  Cardiovascular:     Rate and Rhythm: Normal rate and regular rhythm.     Heart sounds: Normal heart sounds.  Pulmonary:     Effort: Pulmonary effort is normal.     Breath sounds: Normal breath sounds.  Abdominal:     General: Bowel sounds are normal.  Skin:    General: Skin is warm and dry.  Neurological:     Mental Status: She is alert and oriented to person, place, and time.  Psychiatric:        Behavior: Behavior normal.     No results found for any visits on 12/29/22.      Assessment & Plan:   Problem List Items Addressed This Visit       Cardiovascular and Mediastinum   HYPERTENSION,  BENIGN - Primary    BP at goal.         Digestive   GERD (gastroesophageal reflux disease)    We discussed options.  Plan to increase Nexium to twice a day for the next 2 weeks.  Avoid anything greasy, spicy, acidic, caffeine etc.  Also continue to work on portion control.  The Rybelsus certainly could be contributing so that may be something for [place] to consider.  She is due for follow-up colonoscopy this fall send might also have them do an endoscopy.        Endocrine   Multinodular goiter   Controlled type 2 diabetes mellitus without complication, without long-term current use of insulin (HCC)    Doing so far really well with the Rybelsus again the GERD could be coming from the Rybelsus  Will increase to 7 mg.  Follow-up in early October.      Relevant Medications   Semaglutide (RYBELSUS) 7 MG TABS    Meds ordered this encounter  Medications   Semaglutide (RYBELSUS) 7 MG TABS    Sig: Take 1 tablet (7 mg total) by mouth daily.  Dispense:  30 tablet    Refill:  1    No follow-ups on file.  Nani Gasser, MD

## 2022-12-29 NOTE — Assessment & Plan Note (Deleted)
Doing so far really well with the Rybelsus again the GERD could be coming from the Rybelsus  Will increase to 7 mg.  Follow-up in early October.

## 2022-12-29 NOTE — Assessment & Plan Note (Signed)
Doing so far really well with the Rybelsus again the GERD could be coming from the Rybelsus  Will increase to 7 mg.  Follow-up in early October.

## 2022-12-29 NOTE — Assessment & Plan Note (Signed)
BP at goal 

## 2022-12-29 NOTE — Assessment & Plan Note (Signed)
We discussed options.  Plan to increase Nexium to twice a day for the next 2 weeks.  Avoid anything greasy, spicy, acidic, caffeine etc.  Also continue to work on portion control.  The Rybelsus certainly could be contributing so that may be something for [place] to consider.  She is due for follow-up colonoscopy this fall send might also have them do an endoscopy.

## 2023-01-03 ENCOUNTER — Ambulatory Visit: Payer: BLUE CROSS/BLUE SHIELD | Admitting: Family Medicine

## 2023-01-25 ENCOUNTER — Other Ambulatory Visit: Payer: Self-pay | Admitting: Neurological Surgery

## 2023-01-25 DIAGNOSIS — I729 Aneurysm of unspecified site: Secondary | ICD-10-CM

## 2023-02-20 ENCOUNTER — Ambulatory Visit (INDEPENDENT_AMBULATORY_CARE_PROVIDER_SITE_OTHER): Payer: Medicare Other | Admitting: Family Medicine

## 2023-02-20 ENCOUNTER — Encounter: Payer: Self-pay | Admitting: Family Medicine

## 2023-02-20 VITALS — BP 106/59 | HR 74 | Ht 64.0 in | Wt 270.0 lb

## 2023-02-20 DIAGNOSIS — I10 Essential (primary) hypertension: Secondary | ICD-10-CM | POA: Diagnosis not present

## 2023-02-20 DIAGNOSIS — E119 Type 2 diabetes mellitus without complications: Secondary | ICD-10-CM

## 2023-02-20 DIAGNOSIS — E785 Hyperlipidemia, unspecified: Secondary | ICD-10-CM

## 2023-02-20 DIAGNOSIS — Z713 Dietary counseling and surveillance: Secondary | ICD-10-CM

## 2023-02-20 DIAGNOSIS — Z7689 Persons encountering health services in other specified circumstances: Secondary | ICD-10-CM

## 2023-02-20 DIAGNOSIS — E559 Vitamin D deficiency, unspecified: Secondary | ICD-10-CM | POA: Diagnosis not present

## 2023-02-20 LAB — POCT UA - MICROALBUMIN
Creatinine, POC: 300 mg/dL
Microalbumin Ur, POC: 80 mg/L

## 2023-02-20 LAB — POCT GLYCOSYLATED HEMOGLOBIN (HGB A1C): Hemoglobin A1C: 5.7 % — AB (ref 4.0–5.6)

## 2023-02-20 MED ORDER — RYBELSUS 14 MG PO TABS
14.0000 mg | ORAL_TABLET | Freq: Every day | ORAL | 0 refills | Status: DC
Start: 2023-02-20 — End: 2023-03-31

## 2023-02-20 NOTE — Assessment & Plan Note (Addendum)
A1c looks great at 5.7.  Continue current regimen.  Some trace protein in the urine. Continue ARB.  Did discuss the possibility of bumping up the Rybelsus for a month for a trial but if it any point it makes her reflux or GERD worse to please go back down to 7 mg.  She is doing great she is actually lost about 4 pounds since she was last here she can tell her clothes are fitting a little bit better just encouraged her to continue to work on getting adequate protein intake and, portion controlling and increasing activity levels.

## 2023-02-20 NOTE — Assessment & Plan Note (Signed)
Wt Readings from Last 3 Encounters:  02/20/23 270 lb (122.5 kg)  12/29/22 274 lb (124.3 kg)  10/18/22 280 lb (127 kg)   She is doing great. Down 10 lbs from June.  Keep up the good work.

## 2023-02-20 NOTE — Progress Notes (Signed)
Established Patient Office Visit  Subjective   Patient ID: Julia Woods, female    DOB: April 19, 1956  Age: 67 y.o. MRN: [id number]  Chief Complaint  Patient presents with   Hypertension    HPI  Hypertension- Pt denies chest pain, SOB, dizziness, or heart palpitations.  Taking meds as directed w/o problems.  Denies medication side effects.    Diabetes - no hypoglycemic events. No wounds or sores that are not healing well. No increased thirst or urination. Checking glucose at home. Taking medications as prescribed without any side effects.  Currently on Rybelsus 7 mg she says occasionally she will get some nausea when that happens she might skip it for a day or 2 but otherwise tolerates it well without any major side effects or problems.   Scheduled for EGD/colonoscopy on December 12.  Dr. Jason Fila increased her Nexium to 40 mg twice a day until her EGD in December.  Wt Readings from Last 3 Encounters:  02/20/23 270 lb (122.5 kg)  12/29/22 274 lb (124.3 kg)  10/18/22 280 lb (127 kg)         ROS    Objective:     BP (!) 106/59   Pulse 74   Ht 5\' 4"  (1.626 m)   Wt 270 lb (122.5 kg)   LMP 11/13/2008   SpO2 98%   BMI 46.35 kg/m    Physical Exam Vitals and nursing note reviewed.  Constitutional:      Appearance: Normal appearance.  HENT:     Head: Normocephalic and atraumatic.  Eyes:     Conjunctiva/sclera: Conjunctivae normal.  Cardiovascular:     Rate and Rhythm: Normal rate and regular rhythm.  Pulmonary:     Effort: Pulmonary effort is normal.     Breath sounds: Normal breath sounds.  Skin:    General: Skin is warm and dry.  Neurological:     Mental Status: She is alert.  Psychiatric:        Mood and Affect: Mood normal.      Results for orders placed or performed in visit on 02/20/23  POCT HgB A1C  Result Value Ref Range   Hemoglobin A1C 5.7 (A) 4.0 - 5.6 %   HbA1c POC (<> result, manual entry)     HbA1c, POC (prediabetic range)     HbA1c, POC  (controlled diabetic range)    POCT UA - Microalbumin  Result Value Ref Range   Microalbumin Ur, POC 80 mg/L   Creatinine, POC 300 mg/dL   Albumin/Creatinine Ratio, Urine, POC 30-300       The 10-year ASCVD risk score (Arnett DK, et al., 2019) is: 13.4%    Assessment & Plan:   Problem List Items Addressed This Visit       Cardiovascular and Mediastinum   HYPERTENSION, BENIGN    Bp is low today.   Check your BP a few times over the next couple of weeks. If staying less than 110 then we need to reduce your BP pill.       Relevant Orders   CMP14+EGFR   Lipid panel   VITAMIN D 25 Hydroxy (Vit-D Deficiency, Fractures)     Endocrine   Controlled type 2 diabetes mellitus without complication, without long-term current use of insulin (HCC) - Primary    A1c looks great at 5.7.  Continue current regimen.  Some trace protein in the urine. Continue ARB.  Did discuss the possibility of bumping up the Rybelsus for a month for a trial but  if it any point it makes her reflux or GERD worse to please go back down to 7 mg.  She is doing great she is actually lost about 4 pounds since she was last here she can tell her clothes are fitting a little bit better just encouraged her to continue to work on getting adequate protein intake and, portion controlling and increasing activity levels.      Relevant Medications   Semaglutide (RYBELSUS) 14 MG TABS   Other Relevant Orders   POCT HgB A1C (Completed)   POCT UA - Microalbumin (Completed)   CMP14+EGFR   Lipid panel   VITAMIN D 25 Hydroxy (Vit-D Deficiency, Fractures)     Other   Hyperlipidemia    Due to recheck lipoids.        Encounter for weight management    Wt Readings from Last 3 Encounters:  02/20/23 270 lb (122.5 kg)  12/29/22 274 lb (124.3 kg)  10/18/22 280 lb (127 kg)   She is doing great. Down 10 lbs from June.  Keep up the good work.        Other Visit Diagnoses     Vitamin D deficiency       Relevant Orders    CMP14+EGFR   Lipid panel   VITAMIN D 25 Hydroxy (Vit-D Deficiency, Fractures)       Return in about 15 weeks (around 06/05/2023) for Hypertension, Diabetes follow-up.    Nani Gasser, MD

## 2023-02-20 NOTE — Assessment & Plan Note (Signed)
Due to recheck lipoids.

## 2023-02-20 NOTE — Patient Instructions (Signed)
Check your BP a few times over the next couple of weeks. If staying less than 110 then we need to reduce your BP pill.

## 2023-02-20 NOTE — Assessment & Plan Note (Signed)
Bp is low today.   Check your BP a few times over the next couple of weeks. If staying less than 110 then we need to reduce your BP pill.

## 2023-02-21 ENCOUNTER — Ambulatory Visit
Admission: RE | Admit: 2023-02-21 | Discharge: 2023-02-21 | Disposition: A | Discharge status: Home / Self Care | Payer: Medicare Other | Source: Ambulatory Visit | Attending: Neurological Surgery | Admitting: Neurological Surgery

## 2023-02-21 DIAGNOSIS — I729 Aneurysm of unspecified site: Secondary | ICD-10-CM

## 2023-02-21 LAB — CMP14+EGFR
ALT: 21 [IU]/L (ref 0–32)
AST: 24 [IU]/L (ref 0–40)
Albumin: 4.1 g/dL (ref 3.9–4.9)
Alkaline Phosphatase: 113 [IU]/L (ref 44–121)
BUN/Creatinine Ratio: 20 (ref 12–28)
BUN: 19 mg/dL (ref 8–27)
Bilirubin Total: 0.7 mg/dL (ref 0.0–1.2)
CO2: 24 mmol/L (ref 20–29)
Calcium: 9.4 mg/dL (ref 8.7–10.3)
Chloride: 99 mmol/L (ref 96–106)
Creatinine, Ser: 0.97 mg/dL (ref 0.57–1.00)
Globulin, Total: 2.5 g/dL (ref 1.5–4.5)
Glucose: 98 mg/dL (ref 70–99)
Potassium: 3.5 mmol/L (ref 3.5–5.2)
Sodium: 140 mmol/L (ref 134–144)
Total Protein: 6.6 g/dL (ref 6.0–8.5)
eGFR: 63 mL/min/{1.73_m2} (ref >59)

## 2023-02-21 LAB — LIPID PANEL
Chol/HDL Ratio: 2.8 {ratio} (ref 0.0–4.4)
Cholesterol, Total: 192 mg/dL (ref 100–199)
HDL: 68 mg/dL (ref >39)
LDL Chol Calc (NIH): 101 mg/dL — ABNORMAL HIGH (ref 0–99)
Triglycerides: 131 mg/dL (ref 0–149)
VLDL Cholesterol Cal: 23 mg/dL (ref 5–40)

## 2023-02-21 LAB — VITAMIN D 25 HYDROXY (VIT D DEFICIENCY, FRACTURES): Vit D, 25-Hydroxy: 32.9 ng/mL (ref 30.0–100.0)

## 2023-02-21 NOTE — Progress Notes (Signed)
Hi Julia Woods, LDL cholesterol is off just by 1 point.  Continue to work on [employer] and regular exercise its pretty close.  Liver and kidney function look great.  Vitamin D is improved.  Please continue with your daily vitamin D supplement.

## 2023-02-25 ENCOUNTER — Other Ambulatory Visit: Payer: Self-pay | Admitting: Family Medicine

## 2023-03-31 ENCOUNTER — Other Ambulatory Visit: Payer: Self-pay | Admitting: Family Medicine

## 2023-03-31 DIAGNOSIS — E119 Type 2 diabetes mellitus without complications: Secondary | ICD-10-CM

## 2023-04-20 ENCOUNTER — Other Ambulatory Visit: Payer: Self-pay | Admitting: Family Medicine

## 2023-04-20 DIAGNOSIS — I10 Essential (primary) hypertension: Secondary | ICD-10-CM

## 2023-04-20 DIAGNOSIS — F339 Major depressive disorder, recurrent, unspecified: Secondary | ICD-10-CM

## 2023-04-27 LAB — HM COLONOSCOPY

## 2023-05-03 ENCOUNTER — Other Ambulatory Visit: Payer: Self-pay | Admitting: Family Medicine

## 2023-05-03 DIAGNOSIS — E119 Type 2 diabetes mellitus without complications: Secondary | ICD-10-CM

## 2023-05-11 ENCOUNTER — Other Ambulatory Visit: Payer: Self-pay | Admitting: Family Medicine

## 2023-05-11 DIAGNOSIS — E119 Type 2 diabetes mellitus without complications: Secondary | ICD-10-CM

## 2023-05-17 ENCOUNTER — Other Ambulatory Visit: Payer: Self-pay | Admitting: Family Medicine

## 2023-05-17 DIAGNOSIS — I10 Essential (primary) hypertension: Secondary | ICD-10-CM

## 2023-06-05 ENCOUNTER — Encounter: Payer: Self-pay | Admitting: Family Medicine

## 2023-06-05 ENCOUNTER — Ambulatory Visit (INDEPENDENT_AMBULATORY_CARE_PROVIDER_SITE_OTHER): Payer: Medicare Other | Admitting: Family Medicine

## 2023-06-05 VITALS — BP 128/65 | HR 81 | Ht 64.0 in | Wt 262.0 lb

## 2023-06-05 DIAGNOSIS — M150 Primary generalized (osteo)arthritis: Secondary | ICD-10-CM | POA: Diagnosis not present

## 2023-06-05 DIAGNOSIS — E119 Type 2 diabetes mellitus without complications: Secondary | ICD-10-CM

## 2023-06-05 DIAGNOSIS — I10 Essential (primary) hypertension: Secondary | ICD-10-CM | POA: Diagnosis not present

## 2023-06-05 LAB — POCT GLYCOSYLATED HEMOGLOBIN (HGB A1C): Hemoglobin A1C: 5.4 % (ref 4.0–5.6)

## 2023-06-05 MED ORDER — MELOXICAM 7.5 MG PO TABS: 7.5000 mg | ORAL_TABLET | Freq: Every day | ORAL | 0 refills | Status: DC | PRN

## 2023-06-05 NOTE — Assessment & Plan Note (Signed)
Well controlled. Continue current regimen. Follow up in  6 mo  

## 2023-06-05 NOTE — Assessment & Plan Note (Signed)
Discussed using her Tylenol daily okay to use meloxicam a couple times a week but not daily.  Also recommend a trial of topical NSAIDs like Voltaren gel to see if it is helpful.  Also using heat ice and stretches.  If not improving we could always look at switching her venlafaxine to duloxetine.

## 2023-06-05 NOTE — Progress Notes (Signed)
Established Patient Office Visit  Subjective  Patient ID: Julia Woods, female    DOB: 07-23-1955  Age: 68 y.o. MRN: [id number]  Chief Complaint  Patient presents with   Diabetes   Hypertension    HPI   Hypertension- Pt denies chest pain, SOB, dizziness, or heart palpitations.  Taking meds as directed w/o problems.  Denies medication side effects.    Diabetes - no hypoglycemic events. No wounds or sores that are not healing well. No increased thirst or urination. Checking glucose at home. Taking medications as prescribed without any side effects.  Currently on Rybelsus 14 mg and tolerating that well without any side effects or problems.  She does take MiraLAX occasionally.  Is also concerned about the cost of the Rybelsus it is gena cost her a lot of money upfront but then by the summer it should go down significantly.  She did see Dr. Jason Fila and had her colonoscopy done.  They did decrease her Nexium to 1 tab twice a day instead of 2 tabs twice a day and so far she is doing well on that.  She will follow back up with him in May.  Wanted to discuss options for pain for her arthritis.  She is not taking an anti-inflammatory daily she was previously taking meloxicam she has been using some Aleve here and there but primarily relying on Tylenol but wanted to know if there was anything else that she could use.    ROS    Objective:     BP 128/65   Pulse 81   Ht 5\' 4"  (1.626 m)   Wt 262 lb (118.8 kg)   LMP 11/13/2008   SpO2 96%   BMI 44.97 kg/m    Physical Exam Vitals and nursing note reviewed.  Constitutional:      Appearance: Normal appearance.  HENT:     Head: Normocephalic and atraumatic.  Eyes:     Conjunctiva/sclera: Conjunctivae normal.  Cardiovascular:     Rate and Rhythm: Normal rate and regular rhythm.  Pulmonary:     Effort: Pulmonary effort is normal.     Breath sounds: Normal breath sounds.  Skin:    General: Skin is warm and dry.  Neurological:     Mental  Status: She is alert.  Psychiatric:        Mood and Affect: Mood normal.      Results for orders placed or performed in visit on 06/05/23  POCT HgB A1C  Result Value Ref Range   Hemoglobin A1C 5.4 4.0 - 5.6 %   HbA1c POC (<> result, manual entry)     HbA1c, POC (prediabetic range)     HbA1c, POC (controlled diabetic range)        The 10-year ASCVD risk score (Arnett DK, et al., 2019) is: 19.6%    Assessment & Plan:   Problem List Items Addressed This Visit       Cardiovascular and Mediastinum   HYPERTENSION, BENIGN - Primary   Well controlled. Continue current regimen. Follow up in  38mo         Endocrine   Controlled type 2 diabetes mellitus without complication, without long-term current use of insulin (HCC)   A1c looks fantastic today at 5.4.  Currently on Rybelsus 14 mg daily..  She is down another 8 pounds since I last saw her.  BMI now 44.      Relevant Orders   POCT HgB A1C (Completed)     Musculoskeletal and Integument  Osteoarthritis   Discussed using her Tylenol daily okay to use meloxicam a couple times a week but not daily.  Also recommend a trial of topical NSAIDs like Voltaren gel to see if it is helpful.  Also using heat ice and stretches.  If not improving we could always look at switching her venlafaxine to duloxetine.      Relevant Medications   meloxicam (MOBIC) 7.5 MG tablet    Return in about 4 months (around 10/03/2023) for Diabetes follow-up, Hypertension.    Nani Gasser, MD

## 2023-06-05 NOTE — Assessment & Plan Note (Signed)
A1c looks fantastic today at 5.4.  Currently on Rybelsus 14 mg daily..  She is down another 8 pounds since I last saw her.  BMI now 44.

## 2023-07-11 LAB — HM DIABETES EYE EXAM

## 2023-10-03 ENCOUNTER — Encounter: Payer: Self-pay | Admitting: Family Medicine

## 2023-10-03 ENCOUNTER — Ambulatory Visit (INDEPENDENT_AMBULATORY_CARE_PROVIDER_SITE_OTHER): Payer: Medicare Other | Admitting: Family Medicine

## 2023-10-03 ENCOUNTER — Other Ambulatory Visit: Payer: Self-pay | Admitting: Family Medicine

## 2023-10-03 VITALS — BP 128/73 | HR 72 | Ht 64.0 in | Wt 256.0 lb

## 2023-10-03 DIAGNOSIS — E118 Type 2 diabetes mellitus with unspecified complications: Secondary | ICD-10-CM

## 2023-10-03 DIAGNOSIS — I10 Essential (primary) hypertension: Secondary | ICD-10-CM

## 2023-10-03 DIAGNOSIS — R809 Proteinuria, unspecified: Secondary | ICD-10-CM | POA: Insufficient documentation

## 2023-10-03 DIAGNOSIS — K2100 Gastro-esophageal reflux disease with esophagitis, without bleeding: Secondary | ICD-10-CM

## 2023-10-03 DIAGNOSIS — R350 Frequency of micturition: Secondary | ICD-10-CM

## 2023-10-03 DIAGNOSIS — Z780 Asymptomatic menopausal state: Secondary | ICD-10-CM

## 2023-10-03 DIAGNOSIS — F330 Major depressive disorder, recurrent, mild: Secondary | ICD-10-CM

## 2023-10-03 DIAGNOSIS — E1129 Type 2 diabetes mellitus with other diabetic kidney complication: Secondary | ICD-10-CM | POA: Insufficient documentation

## 2023-10-03 DIAGNOSIS — Z7689 Persons encountering health services in other specified circumstances: Secondary | ICD-10-CM

## 2023-10-03 DIAGNOSIS — Z1231 Encounter for screening mammogram for malignant neoplasm of breast: Secondary | ICD-10-CM

## 2023-10-03 DIAGNOSIS — E559 Vitamin D deficiency, unspecified: Secondary | ICD-10-CM

## 2023-10-03 LAB — POCT GLYCOSYLATED HEMOGLOBIN (HGB A1C): Hemoglobin A1C: 5.3 % (ref 4.0–5.6)

## 2023-10-03 NOTE — Assessment & Plan Note (Signed)
 A1c looks fantastic today at 5.3 continue with Rybelsus  14 mg.  She is actually lost a total of 24 pounds since last summer.  Need to work on healthy diet portioning and regular exercise she is thinking about maybe getting back into some water aerobics.  She really has done fantastic and I just want her to continue with that momentum of change.

## 2023-10-03 NOTE — Assessment & Plan Note (Signed)
 Now down to once a day on the Nexium she has been tapering slowly.  She will have a follow-up with her new GI in August as Dr. Algie Antis has retired.  And they are hopefully going to be transitioning her to an H2 blocker at that time.

## 2023-10-03 NOTE — Progress Notes (Signed)
 Established Patient Office Visit  Subjective  Patient ID: Julia Woods, female    DOB: November 12, 1955  Age: 68 y.o. MRN: [id number]  Chief Complaint  Patient presents with   Diabetes   Hypertension    HPI  Diabetes - no hypoglycemic events. No wounds or sores that are not healing well. No increased thirst or urination. Checking glucose at home. Taking medications as prescribed without any side effects.  Moderating Rybelsus  well occasionally she does have to take a laxative but not daily.  Hypertension- Pt denies chest pain, SOB, dizziness, or heart palpitations.  Taking meds as directed w/o problems.  Denies medication side effects.  Looked great at GI office.    She is doing well otherwise she is currently taking her vitamin D  supplement.  Mood has been stable no concerns she does not want make any changes to her venlafaxine  today.  PHQ 2 was 0.  More recently she has had some urinary frequency and not feeling like she is completely emptying.  Urines been a little bit cloudy in the mornings but no dysuria or fever or chills.     ROS    Objective:     BP 128/73   Pulse 72   Ht 5\' 4"  (1.626 m)   Wt 256 lb (116.1 kg)   LMP 11/13/2008   SpO2 96%   BMI 43.94 kg/m     Physical Exam Vitals and nursing note reviewed.  Constitutional:      Appearance: Normal appearance.  HENT:     Head: Normocephalic and atraumatic.  Eyes:     Conjunctiva/sclera: Conjunctivae normal.  Cardiovascular:     Rate and Rhythm: Normal rate and regular rhythm.  Pulmonary:     Effort: Pulmonary effort is normal.     Breath sounds: Normal breath sounds.  Skin:    General: Skin is warm and dry.  Neurological:     Mental Status: She is alert.  Psychiatric:        Mood and Affect: Mood normal.     Results for orders placed or performed in visit on 10/03/23  POCT HgB A1C  Result Value Ref Range   Hemoglobin A1C 5.3 4.0 - 5.6 %   HbA1c POC (<> result, manual entry)     HbA1c, POC (prediabetic  range)     HbA1c, POC (controlled diabetic range)         The 10-year ASCVD risk score (Arnett DK, et al., 2019) is: 19.6%    Assessment & Plan:   Problem List Items Addressed This Visit       Cardiovascular and Mediastinum   HYPERTENSION, BENIGN - Primary   Relevant Orders   VITAMIN D  25 Hydroxy (Vit-D Deficiency, Fractures)   CMP14+EGFR   Urine Microalbumin w/creat. ratio     Digestive   GERD (gastroesophageal reflux disease)   Now down to once a day on the Nexium she has been tapering slowly.  She will have a follow-up with her new GI in August as Dr. Algie Antis has retired.  And they are hopefully going to be transitioning her to an H2 blocker at that time.        Endocrine   Diabetes mellitus with microalbuminuria (HCC)   Last urine showing mild proteinuria. Will monitor carefully.        Relevant Orders   VITAMIN D  25 Hydroxy (Vit-D Deficiency, Fractures)   CMP14+EGFR   Urine Microalbumin w/creat. ratio   Diabetes mellitus type 2, controlled, with complications (HCC)  A1c looks fantastic today at 5.3 continue with Rybelsus  14 mg.  She is actually lost a total of 24 pounds since last summer.  Need to work on healthy diet portioning and regular exercise she is thinking about maybe getting back into some water aerobics.  She really has done fantastic and I just want her to continue with that momentum of change.      Relevant Orders   VITAMIN D  25 Hydroxy (Vit-D Deficiency, Fractures)   CMP14+EGFR   Urine Microalbumin w/creat. ratio   POCT HgB A1C (Completed)     Other   Morbid obesity (HCC)   Encounter for weight management      Wt Readings from Last 3 Encounters:  02/20/23 270 lb (122.5 kg)  12/29/22 274 lb (124.3 kg)  10/18/22 280 lb (127 kg)  06/05/23         262 lb 10/03/23         256 lb  She is doing great. Down 24 lbs from June.  Keep up the good work.       Depression, major, recurrent (HCC)   Other Visit Diagnoses       Urinary frequency        Relevant Orders   Urinalysis, Routine w reflex microscopic     Post-menopausal       Relevant Orders   DG Bone Density     Vitamin D  deficiency       Relevant Orders   VITAMIN D  25 Hydroxy (Vit-D Deficiency, Fractures)       Return in about 4 months (around 02/03/2024) for Diabetes follow-up.    Duaine German, MD

## 2023-10-03 NOTE — Assessment & Plan Note (Addendum)
  Wt Readings from Last 3 Encounters:  02/20/23 270 lb (122.5 kg)  12/29/22 274 lb (124.3 kg)  10/18/22 280 lb (127 kg)  06/05/23         262 lb 10/03/23         256 lb  She is doing great. Down 24 lbs from June.  Keep up the good work.

## 2023-10-03 NOTE — Assessment & Plan Note (Signed)
 Last urine showing mild proteinuria. Will monitor carefully.

## 2023-10-04 ENCOUNTER — Ambulatory Visit: Payer: Self-pay | Admitting: Family Medicine

## 2023-10-04 DIAGNOSIS — M8580 Other specified disorders of bone density and structure, unspecified site: Secondary | ICD-10-CM

## 2023-10-04 MED ORDER — NITROFURANTOIN MONOHYD MACRO 100 MG PO CAPS: 100.0000 mg | ORAL_CAPSULE | Freq: Two times a day (BID) | ORAL | 0 refills | Status: DC

## 2023-10-04 NOTE — Progress Notes (Signed)
 Hi Julia Woods, your kidney function is stable.  Liver enzymes are normal.  Urine sample does show some increased white blood cells and bacteria. I will send in a rx for an antibiotic.   Vitamin D  is still on the low end of normal but it is technically in the normal range I would recommend continuing with 25 mcg daily of over-the-counter vitamin D .  Orders Placed This Encounter     nitrofurantoin, macrocrystal-monohydrate, (MACROBID) 100 MG capsule         Sig: Take 1 capsule (100 mg total) by mouth 2 (two) times daily.         Dispense:  10 capsule         Refill:  0

## 2023-10-05 LAB — URINALYSIS, ROUTINE W REFLEX MICROSCOPIC
Bilirubin, UA: NEGATIVE
Glucose, UA: NEGATIVE
Ketones, UA: NEGATIVE
Nitrite, UA: NEGATIVE
RBC, UA: NEGATIVE
Specific Gravity, UA: 1.025 (ref 1.005–1.030)
Urobilinogen, Ur: 0.2 mg/dL (ref 0.2–1.0)
pH, UA: 6.5 (ref 5.0–7.5)

## 2023-10-05 LAB — CMP14+EGFR
ALT: 19 IU/L (ref 0–32)
AST: 22 IU/L (ref 0–40)
Albumin: 4.3 g/dL (ref 3.9–4.9)
Alkaline Phosphatase: 119 IU/L (ref 44–121)
BUN/Creatinine Ratio: 19 (ref 12–28)
BUN: 19 mg/dL (ref 8–27)
Bilirubin Total: 0.8 mg/dL (ref 0.0–1.2)
CO2: 22 mmol/L (ref 20–29)
Calcium: 10.1 mg/dL (ref 8.7–10.3)
Chloride: 101 mmol/L (ref 96–106)
Creatinine, Ser: 1.02 mg/dL — ABNORMAL HIGH (ref 0.57–1.00)
Globulin, Total: 2.3 g/dL (ref 1.5–4.5)
Glucose: 88 mg/dL (ref 70–99)
Potassium: 4.3 mmol/L (ref 3.5–5.2)
Sodium: 141 mmol/L (ref 134–144)
Total Protein: 6.6 g/dL (ref 6.0–8.5)
eGFR: 60 mL/min/{1.73_m2} (ref >59)

## 2023-10-05 LAB — MICROSCOPIC EXAMINATION
Casts: NONE SEEN /LPF
Epithelial Cells (non renal): >10 /HPF — AB (ref 0–10)
WBC, UA: >30 /HPF — AB (ref 0–5)

## 2023-10-05 LAB — MICROALBUMIN / CREATININE URINE RATIO
Creatinine, Urine: 244.3 mg/dL
Microalb/Creat Ratio: 6 mg/g{creat} (ref 0–29)
Microalbumin, Urine: 15.1 ug/mL

## 2023-10-05 LAB — VITAMIN D 25 HYDROXY (VIT D DEFICIENCY, FRACTURES): Vit D, 25-Hydroxy: 33.3 ng/mL (ref 30.0–100.0)

## 2023-10-06 NOTE — Progress Notes (Signed)
 HI Julia Woods, no excess protein in the urine.

## 2023-10-25 ENCOUNTER — Other Ambulatory Visit: Payer: Self-pay | Admitting: Family Medicine

## 2023-10-25 DIAGNOSIS — E785 Hyperlipidemia, unspecified: Secondary | ICD-10-CM

## 2023-11-08 ENCOUNTER — Other Ambulatory Visit: Payer: Self-pay | Admitting: Family Medicine

## 2023-11-08 DIAGNOSIS — M150 Primary generalized (osteo)arthritis: Secondary | ICD-10-CM

## 2023-11-10 ENCOUNTER — Ambulatory Visit

## 2023-11-10 DIAGNOSIS — Z780 Asymptomatic menopausal state: Secondary | ICD-10-CM | POA: Diagnosis not present

## 2023-11-15 DIAGNOSIS — M8580 Other specified disorders of bone density and structure, unspecified site: Secondary | ICD-10-CM | POA: Insufficient documentation

## 2023-11-15 NOTE — Progress Notes (Signed)
 Hi Julia Woods, bone density shows a T-score of -1.5.  No significant change from your prior DEXA which is great.  Bone still considered osteopenia or mildly thin.  Continue to take adequate calcium  and vitamin D  as well as weightbearing exercise.

## 2023-11-22 ENCOUNTER — Ambulatory Visit

## 2023-11-22 ENCOUNTER — Other Ambulatory Visit

## 2023-12-13 ENCOUNTER — Ambulatory Visit

## 2023-12-13 DIAGNOSIS — Z1231 Encounter for screening mammogram for malignant neoplasm of breast: Secondary | ICD-10-CM

## 2023-12-15 ENCOUNTER — Ambulatory Visit: Payer: Self-pay | Admitting: Family Medicine

## 2023-12-15 NOTE — Progress Notes (Signed)
 Please call patient. Normal mammogram.  Repeat in 1 year.

## 2024-01-17 ENCOUNTER — Ambulatory Visit (INDEPENDENT_AMBULATORY_CARE_PROVIDER_SITE_OTHER)

## 2024-01-17 VITALS — Temp 98.2°F

## 2024-01-17 DIAGNOSIS — Z23 Encounter for immunization: Secondary | ICD-10-CM

## 2024-01-17 NOTE — Progress Notes (Signed)
 Patient is here for HD influenza. Patient tolerated the injection well.  Location: Left deltoid

## 2024-02-06 ENCOUNTER — Ambulatory Visit: Admitting: Family Medicine

## 2024-02-08 ENCOUNTER — Other Ambulatory Visit: Payer: Self-pay | Admitting: Family Medicine

## 2024-02-08 DIAGNOSIS — F339 Major depressive disorder, recurrent, unspecified: Secondary | ICD-10-CM

## 2024-02-08 DIAGNOSIS — I10 Essential (primary) hypertension: Secondary | ICD-10-CM

## 2024-02-10 ENCOUNTER — Other Ambulatory Visit: Payer: Self-pay | Admitting: Family Medicine

## 2024-02-10 DIAGNOSIS — E119 Type 2 diabetes mellitus without complications: Secondary | ICD-10-CM

## 2024-02-22 ENCOUNTER — Ambulatory Visit: Admitting: Family Medicine

## 2024-03-25 ENCOUNTER — Ambulatory Visit (INDEPENDENT_AMBULATORY_CARE_PROVIDER_SITE_OTHER): Admitting: Family Medicine

## 2024-03-25 ENCOUNTER — Encounter: Payer: Self-pay | Admitting: Family Medicine

## 2024-03-25 VITALS — BP 130/71 | HR 76 | Ht 64.0 in | Wt 254.0 lb

## 2024-03-25 DIAGNOSIS — E119 Type 2 diabetes mellitus without complications: Secondary | ICD-10-CM

## 2024-03-25 DIAGNOSIS — R809 Proteinuria, unspecified: Secondary | ICD-10-CM | POA: Diagnosis not present

## 2024-03-25 DIAGNOSIS — L659 Nonscarring hair loss, unspecified: Secondary | ICD-10-CM

## 2024-03-25 DIAGNOSIS — E1129 Type 2 diabetes mellitus with other diabetic kidney complication: Secondary | ICD-10-CM

## 2024-03-25 DIAGNOSIS — F330 Major depressive disorder, recurrent, mild: Secondary | ICD-10-CM | POA: Diagnosis not present

## 2024-03-25 DIAGNOSIS — M150 Primary generalized (osteo)arthritis: Secondary | ICD-10-CM | POA: Diagnosis not present

## 2024-03-25 DIAGNOSIS — I10 Essential (primary) hypertension: Secondary | ICD-10-CM | POA: Diagnosis not present

## 2024-03-25 DIAGNOSIS — E559 Vitamin D deficiency, unspecified: Secondary | ICD-10-CM | POA: Insufficient documentation

## 2024-03-25 LAB — POCT GLYCOSYLATED HEMOGLOBIN (HGB A1C): Hemoglobin A1C: 5.4 % (ref 4.0–5.6)

## 2024-03-25 MED ORDER — MELOXICAM 7.5 MG PO TABS: 7.5000 mg | ORAL_TABLET | Freq: Every day | ORAL | 0 refills | Status: AC | PRN

## 2024-03-25 NOTE — Assessment & Plan Note (Addendum)
 Well controlled. Continue current regimen. Follow up in  50mo  \Essential hypertension Blood pressure slightly elevated at 131 mmHg, likely due to recent medication intake. Occasional lightheadedness upon standing. - Monitor blood pressure regularly. - Contact provider if lightheadedness becomes more frequent.

## 2024-03-25 NOTE — Patient Instructions (Signed)
-   Consider starting Rogaine (minoxidil) for women.

## 2024-03-25 NOTE — Progress Notes (Signed)
 Established Patient Office Visit  Patient ID: Julia Woods, female    DOB: 09/25/1955  Age: 68 y.o. MRN: [id number] PCP: Alvan Dorothyann BIRCH, MD  Chief Complaint  Patient presents with   Hypertension   Diabetes    Subjective:     HPI  Discussed the use of AI scribe software for clinical note transcription with the patient, who gave verbal consent to proceed.  History of Present Illness Julia Woods is a 68 year old female with type 2 diabetes who presents for routine follow-up.  Glycemic control and gastrointestinal symptoms - Type 2 diabetes with well-controlled blood glucose, most recent A1c 5.4% - No symptoms of hypoglycemia such as shakiness or sweating - Occasional episodes of waking up in a sweat - Currently taking Rybelsus , generally well tolerated - Intermittent stomach discomfort and irregular bowel movements, attributed to dietary factors  Blood pressure and orthostatic symptoms - Blood pressure recorded at 131 mmHg, considered acceptable by patient - Occasional lightheadedness upon standing, infrequent - Currently taking losartan  HCT for blood pressure management  Mood symptoms - Currently taking venlafaxine  for mood management - Mood described as 'about the same'  Alopecia - Experiencing hair loss - Currently taking Nutrafol supplement for hair growth  Weight loss - Approximately 40 pounds of weight loss achieved - Clothes fit better - Weight loss attributed to a gradual and steady approach, considered sustainable     ROS    Objective:     BP 130/71   Pulse 76   Ht 5' 4 (1.626 m)   Wt 254 lb 0.6 oz (115.2 kg)   LMP 11/13/2008   SpO2 95%   BMI 43.61 kg/m    Physical Exam Vitals and nursing note reviewed.  Constitutional:      Appearance: Normal appearance.  HENT:     Head: Normocephalic and atraumatic.  Eyes:     Conjunctiva/sclera: Conjunctivae normal.  Cardiovascular:     Rate and Rhythm: Normal rate and regular rhythm.   Pulmonary:     Effort: Pulmonary effort is normal.     Breath sounds: Normal breath sounds.  Skin:    General: Skin is warm and dry.  Neurological:     Mental Status: She is alert.  Psychiatric:        Mood and Affect: Mood normal.      Results for orders placed or performed in visit on 03/25/24  POCT HgB A1C  Result Value Ref Range   Hemoglobin A1C 5.4 4.0 - 5.6 %   HbA1c POC (<> result, manual entry)     HbA1c, POC (prediabetic range)     HbA1c, POC (controlled diabetic range)        The 10-year ASCVD risk score (Arnett DK, et al., 2019) is: 22.5%    Assessment & Plan:   Problem List Items Addressed This Visit       Cardiovascular and Mediastinum   HYPERTENSION, BENIGN   Well controlled. Continue current regimen. Follow up in  38mo  \Essential hypertension Blood pressure slightly elevated at 131 mmHg, likely due to recent medication intake. Occasional lightheadedness upon standing. - Monitor blood pressure regularly. - Contact provider if lightheadedness becomes more frequent.      Relevant Orders   CMP14+EGFR   Lipid panel   VITAMIN D  25 Hydroxy (Vit-D Deficiency, Fractures)     Endocrine   Diabetes mellitus with microalbuminuria (HCC)   Type 2 diabetes mellitus Well-controlled with A1c of 5.4. No hypoglycemia reported, but nocturnal sweating suggests possible nocturnal  hypoglycemia. Rybelsus  well-tolerated with occasional GI side effects. Gradual weight loss beneficial. - Check blood sugar upon waking if nocturnal sweating occurs. - Increase protein intake before bedtime if nocturnal hypoglycemia is suspected. - Continue Rybelsus  14 mg daily. - Monitor for GI side effects and manage as needed.      Relevant Orders   POCT HgB A1C (Completed)   CMP14+EGFR   Lipid panel   VITAMIN D  25 Hydroxy (Vit-D Deficiency, Fractures)     Musculoskeletal and Integument   Osteoarthritis - Primary   Relevant Medications   meloxicam  (MOBIC ) 7.5 MG tablet   Other  Relevant Orders   CMP14+EGFR   Lipid panel   VITAMIN D  25 Hydroxy (Vit-D Deficiency, Fractures)     Other   Vitamin D  deficiency   Relevant Orders   VITAMIN D  25 Hydroxy (Vit-D Deficiency, Fractures)   Depression, major, recurrent    Depression Well-managed with venlafaxine  750 mg daily. No dosage adjustment needed. - Continue venlafaxine  750 mg daily.      Other Visit Diagnoses       Hair loss           Assessment and Plan Assessment & Plan  Alopecia Hair loss possibly related to Rybelsus  or weight loss. Discussed potential use of Rogaine for women, requires continuous use. - Consider starting Rogaine (minoxidil) for women. - Continue Nutrafol supplement. - Monitor hair growth over 4-6 months.  General Health Maintenance Discussed importance of healthy lifestyle choices for weight management and overall health. - Continue healthy diet and regular exercise. - Monitor weight and adjust lifestyle as needed.    Return in about 4 months (around 07/23/2024) for Diabetes follow-up, Hypertension.    Dorothyann Byars, MD [facility] at [facility]

## 2024-03-25 NOTE — Assessment & Plan Note (Signed)
 Type 2 diabetes mellitus Well-controlled with A1c of 5.4. No hypoglycemia reported, but nocturnal sweating suggests possible nocturnal hypoglycemia. Rybelsus  well-tolerated with occasional GI side effects. Gradual weight loss beneficial. - Check blood sugar upon waking if nocturnal sweating occurs. - Increase protein intake before bedtime if nocturnal hypoglycemia is suspected. - Continue Rybelsus  14 mg daily. - Monitor for GI side effects and manage as needed.

## 2024-03-25 NOTE — Assessment & Plan Note (Signed)
  Depression Well-managed with venlafaxine  750 mg daily. No dosage adjustment needed. - Continue venlafaxine  750 mg daily.

## 2024-03-26 ENCOUNTER — Ambulatory Visit: Payer: Self-pay | Admitting: Family Medicine

## 2024-03-26 LAB — CMP14+EGFR
ALT: 15 IU/L (ref 0–32)
AST: 20 IU/L (ref 0–40)
Albumin: 4.3 g/dL (ref 3.9–4.9)
Alkaline Phosphatase: 115 IU/L (ref 49–135)
BUN/Creatinine Ratio: 23 (ref 12–28)
BUN: 21 mg/dL (ref 8–27)
Bilirubin Total: 0.7 mg/dL (ref 0.0–1.2)
CO2: 25 mmol/L (ref 20–29)
Calcium: 9.7 mg/dL (ref 8.7–10.3)
Chloride: 98 mmol/L (ref 96–106)
Creatinine, Ser: 0.93 mg/dL (ref 0.57–1.00)
Globulin, Total: 2.3 g/dL (ref 1.5–4.5)
Glucose: 87 mg/dL (ref 70–99)
Potassium: 3.8 mmol/L (ref 3.5–5.2)
Sodium: 137 mmol/L (ref 134–144)
Total Protein: 6.6 g/dL (ref 6.0–8.5)
eGFR: 66 mL/min/1.73 (ref >59)

## 2024-03-26 LAB — LIPID PANEL
Chol/HDL Ratio: 2.9 ratio (ref 0.0–4.4)
Cholesterol, Total: 200 mg/dL — ABNORMAL HIGH (ref 100–199)
HDL: 68 mg/dL (ref >39)
LDL Chol Calc (NIH): 113 mg/dL — ABNORMAL HIGH (ref 0–99)
Triglycerides: 110 mg/dL (ref 0–149)
VLDL Cholesterol Cal: 19 mg/dL (ref 5–40)

## 2024-03-26 LAB — VITAMIN D 25 HYDROXY (VIT D DEFICIENCY, FRACTURES): Vit D, 25-Hydroxy: 33.6 ng/mL (ref 30.0–100.0)

## 2024-03-26 NOTE — Progress Notes (Signed)
 HI Salisha, your LDL is up a little. Continue to work on healthy on diet and exercise. Vit D still on low end of normal. Please keep taking your vitamin D . Other labs look good.

## 2024-05-08 ENCOUNTER — Other Ambulatory Visit: Payer: Self-pay | Admitting: Family Medicine

## 2024-05-08 DIAGNOSIS — I10 Essential (primary) hypertension: Secondary | ICD-10-CM

## 2024-07-23 ENCOUNTER — Ambulatory Visit: Admitting: Family Medicine
